# Patient Record
Sex: Female | Born: 1985 | Race: White | Hispanic: No | Marital: Single | Smoking: Never smoker
Health system: Southern US, Community
[De-identification: ages and names within clinical notes are randomized; demographics above are authoritative.]

## PROBLEM LIST (undated history)

## (undated) ENCOUNTER — Emergency Department (HOSPITAL_COMMUNITY): Admission: EM | Payer: Self-pay | Source: Home / Self Care

## (undated) DIAGNOSIS — F419 Anxiety disorder, unspecified: Secondary | ICD-10-CM

## (undated) DIAGNOSIS — E559 Vitamin D deficiency, unspecified: Secondary | ICD-10-CM

## (undated) DIAGNOSIS — F429 Obsessive-compulsive disorder, unspecified: Secondary | ICD-10-CM

## (undated) DIAGNOSIS — K449 Diaphragmatic hernia without obstruction or gangrene: Secondary | ICD-10-CM

## (undated) DIAGNOSIS — F329 Major depressive disorder, single episode, unspecified: Secondary | ICD-10-CM

## (undated) DIAGNOSIS — F32A Depression, unspecified: Secondary | ICD-10-CM

## (undated) DIAGNOSIS — F909 Attention-deficit hyperactivity disorder, unspecified type: Secondary | ICD-10-CM

## (undated) DIAGNOSIS — K219 Gastro-esophageal reflux disease without esophagitis: Secondary | ICD-10-CM

## (undated) HISTORY — PX: COLPOSCOPY: SHX161

## (undated) HISTORY — DX: Obsessive-compulsive disorder, unspecified: F42.9

## (undated) HISTORY — DX: Depression, unspecified: F32.A

## (undated) HISTORY — DX: Diaphragmatic hernia without obstruction or gangrene: K44.9

## (undated) HISTORY — DX: Gastro-esophageal reflux disease without esophagitis: K21.9

## (undated) HISTORY — DX: Attention-deficit hyperactivity disorder, unspecified type: F90.9

## (undated) HISTORY — DX: Vitamin D deficiency, unspecified: E55.9

## (undated) HISTORY — DX: Major depressive disorder, single episode, unspecified: F32.9

---

## 2012-04-29 ENCOUNTER — Encounter (HOSPITAL_COMMUNITY): Payer: Self-pay | Admitting: *Deleted

## 2012-04-29 ENCOUNTER — Emergency Department (HOSPITAL_COMMUNITY): Payer: No Typology Code available for payment source

## 2012-04-29 ENCOUNTER — Emergency Department (HOSPITAL_COMMUNITY)
Admission: EM | Admit: 2012-04-29 | Discharge: 2012-04-30 | Disposition: A | Discharge status: Home / Self Care | Payer: No Typology Code available for payment source | Attending: Emergency Medicine | Admitting: Emergency Medicine

## 2012-04-29 DIAGNOSIS — Z043 Encounter for examination and observation following other accident: Secondary | ICD-10-CM | POA: Diagnosis present

## 2012-04-29 DIAGNOSIS — F411 Generalized anxiety disorder: Secondary | ICD-10-CM | POA: Insufficient documentation

## 2012-04-29 DIAGNOSIS — Z882 Allergy status to sulfonamides status: Secondary | ICD-10-CM | POA: Insufficient documentation

## 2012-04-29 DIAGNOSIS — S2220XA Unspecified fracture of sternum, initial encounter for closed fracture: Secondary | ICD-10-CM | POA: Insufficient documentation

## 2012-04-29 DIAGNOSIS — R071 Chest pain on breathing: Secondary | ICD-10-CM | POA: Diagnosis present

## 2012-04-29 HISTORY — DX: Anxiety disorder, unspecified: F41.9

## 2012-04-29 MED ORDER — OXYCODONE-ACETAMINOPHEN 5-325 MG PO TABS
1.0000 | ORAL_TABLET | Freq: Once | ORAL | Status: AC
  Administered 2012-04-29: 1 via ORAL
  Filled 2012-04-29: qty 1

## 2012-04-29 MED ORDER — IBUPROFEN 800 MG PO TABS
800.0000 mg | ORAL_TABLET | Freq: Once | ORAL | Status: AC
  Administered 2012-04-29: 800 mg via ORAL
  Filled 2012-04-29: qty 1

## 2012-04-29 MED ORDER — IBUPROFEN 800 MG PO TABS: 800.0000 mg | ORAL_TABLET | Freq: Three times a day (TID) | ORAL | Status: AC

## 2012-04-29 MED ORDER — OXYCODONE-ACETAMINOPHEN 5-325 MG PO TABS: 2.0000 | ORAL_TABLET | ORAL | Status: AC | PRN
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
## 2012-04-29 NOTE — ED Notes (Signed)
Patient transported to X-ray 
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
## 2012-05-02 NOTE — ED Provider Notes (Signed)
Medical screening examination/treatment/procedure(s) were performed by non-physician practitioner and as supervising physician I was immediately available for consultation/collaboration.  Toy Baker, MD 05/02/12 [phone]

## 2013-06-17 ENCOUNTER — Encounter (HOSPITAL_COMMUNITY): Payer: Self-pay | Admitting: Emergency Medicine

## 2013-06-17 ENCOUNTER — Emergency Department (HOSPITAL_COMMUNITY)
Admission: EM | Admit: 2013-06-17 | Discharge: 2013-06-17 | Disposition: A | Discharge status: Home / Self Care | Payer: BC Managed Care – PPO | Attending: Emergency Medicine | Admitting: Emergency Medicine

## 2013-06-17 ENCOUNTER — Emergency Department (HOSPITAL_COMMUNITY): Payer: BC Managed Care – PPO

## 2013-06-17 DIAGNOSIS — F411 Generalized anxiety disorder: Secondary | ICD-10-CM | POA: Insufficient documentation

## 2013-06-17 DIAGNOSIS — T23209A Burn of second degree of unspecified hand, unspecified site, initial encounter: Secondary | ICD-10-CM | POA: Insufficient documentation

## 2013-06-17 DIAGNOSIS — M436 Torticollis: Secondary | ICD-10-CM | POA: Insufficient documentation

## 2013-06-17 DIAGNOSIS — Y9389 Activity, other specified: Secondary | ICD-10-CM | POA: Insufficient documentation

## 2013-06-17 DIAGNOSIS — Y92410 Unspecified street and highway as the place of occurrence of the external cause: Secondary | ICD-10-CM | POA: Insufficient documentation

## 2013-06-17 DIAGNOSIS — S63509A Unspecified sprain of unspecified wrist, initial encounter: Secondary | ICD-10-CM | POA: Insufficient documentation

## 2013-06-17 DIAGNOSIS — T310 Burns involving less than 10% of body surface: Secondary | ICD-10-CM | POA: Insufficient documentation

## 2013-06-17 DIAGNOSIS — Z79899 Other long term (current) drug therapy: Secondary | ICD-10-CM | POA: Insufficient documentation

## 2013-06-17 DIAGNOSIS — T300 Burn of unspecified body region, unspecified degree: Secondary | ICD-10-CM

## 2013-06-17 MED ORDER — HYDROCODONE-ACETAMINOPHEN 5-325 MG PO TABS
1.0000 | ORAL_TABLET | Freq: Once | ORAL | Status: AC
  Administered 2013-06-17: 1 via ORAL
  Filled 2013-06-17: qty 1

## 2013-06-17 MED ORDER — HYDROCODONE-ACETAMINOPHEN 5-325 MG PO TABS: 1.0000 | ORAL_TABLET | ORAL | Status: DC | PRN

## 2013-06-17 MED ORDER — IBUPROFEN 800 MG PO TABS: 800.0000 mg | ORAL_TABLET | Freq: Three times a day (TID) | ORAL | Status: DC

## 2013-06-17 MED ORDER — CYCLOBENZAPRINE HCL 10 MG PO TABS: 10.0000 mg | ORAL_TABLET | Freq: Two times a day (BID) | ORAL | Status: DC | PRN

## 2013-06-17 NOTE — ED Notes (Signed)
Discharge instructions reviewed with patient and female friend Rx x 3 reviewed with patient and female friend Follow up instructions reviewed with patient and female friend Patient and female friend verbalized understanding to DC instructions, Rx and follow up care All questions answered  Patient and female friend deny further needs or concerns at this time Patient alert and oriented x 4 upon time of DC and in NAD Patient declined wheelchair, ambulated without difficutly
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
## 2013-06-17 NOTE — ED Notes (Signed)
PA at bedside.
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
## 2013-06-21 NOTE — ED Provider Notes (Signed)
Medical screening examination/treatment/procedure(s) were performed by non-physician practitioner and as supervising physician I was immediately available for consultation/collaboration.  EKG Interpretation   None         Juwon Scripter F Jeily Guthridge, MD 06/21/13 0730 

## 2013-10-28 ENCOUNTER — Encounter (HOSPITAL_COMMUNITY): Payer: Self-pay | Admitting: Emergency Medicine

## 2013-10-28 ENCOUNTER — Emergency Department (HOSPITAL_COMMUNITY)
Admission: EM | Admit: 2013-10-28 | Discharge: 2013-10-28 | Disposition: A | Discharge status: Home / Self Care | Payer: BC Managed Care – PPO | Source: Home / Self Care

## 2013-10-28 DIAGNOSIS — J111 Influenza due to unidentified influenza virus with other respiratory manifestations: Secondary | ICD-10-CM

## 2013-10-28 LAB — POCT RAPID STREP A: STREPTOCOCCUS, GROUP A SCREEN (DIRECT): NEGATIVE

## 2013-10-28 MED ORDER — OSELTAMIVIR PHOSPHATE 75 MG PO CAPS: 75.0000 mg | ORAL_CAPSULE | Freq: Two times a day (BID) | ORAL | Status: DC

## 2013-10-28 MED ORDER — ONDANSETRON HCL 4 MG PO TABS: 4.0000 mg | ORAL_TABLET | Freq: Four times a day (QID) | ORAL | Status: DC

## 2013-10-28 NOTE — ED Provider Notes (Signed)
Medical screening examination/treatment/procedure(s) were performed by resident physician or non-physician practitioner and as supervising physician I was immediately available for consultation/collaboration.   Pauline Good MD.   Billy Fischer, MD 10/28/13 2129

## 2013-10-28 NOTE — ED Provider Notes (Signed)
CSN: [id number]     Arrival date & time 10/28/13  1646 History   First MD Initiated Contact with Patient 10/28/13 1916     Chief Complaint  Patient presents with  . Fever  . Cough   (Consider location/radiation/quality/duration/timing/severity/associated sxs/prior Treatment) HPI Comments: 28 y o f with complaints of flu-like sx's starting yesterday AM. Fever 100.8 yesterday to 102.5 this PM   Past Medical History  Diagnosis Date  . Anxiety    History reviewed. No pertinent past surgical history. No family history on file. History  Substance Use Topics  . Smoking status: Never Smoker   . Smokeless tobacco: Not on file  . Alcohol Use: No   OB History   Grav Para Term Preterm Abortions TAB SAB Ect Mult Living                 Review of Systems  Constitutional: Positive for fever, chills, activity change and appetite change. Negative for fatigue.  HENT: Positive for congestion, ear pain, postnasal drip, rhinorrhea and sore throat. Negative for facial swelling.   Eyes: Negative.   Respiratory: Positive for cough. Negative for wheezing.   Cardiovascular: Negative.   Gastrointestinal: Positive for nausea. Negative for vomiting, diarrhea and blood in stool.       Mild lower abdominal discomfort  Genitourinary: Negative for dysuria, urgency, frequency, flank pain, vaginal bleeding, vaginal discharge, difficulty urinating, vaginal pain, menstrual problem and pelvic pain.  Musculoskeletal: Negative for neck pain and neck stiffness.  Skin: Negative for pallor and rash.  Neurological: Negative.     Allergies  Sulfa antibiotics  Home Medications   Current Outpatient Rx  Name  Route  Sig  Dispense  Refill  . acetaminophen (TYLENOL) 325 MG tablet   Oral   Take 650 mg by mouth every 6 (six) hours as needed for pain (pain).         . cyclobenzaprine (FLEXERIL) 10 MG tablet   Oral   Take 1 tablet (10 mg total) by mouth 2 (two) times daily as needed for muscle spasms.   20  tablet   0   . desogestrel-ethinyl estradiol (APRI) 0.15-30 MG-MCG tablet   Oral   Take 1 tablet by mouth daily.         . diphenhydrAMINE (BENADRYL) 25 MG tablet   Oral   Take 50 mg by mouth every 6 (six) hours as needed for itching (allergies).         Marland Kitchen FLUoxetine (PROZAC) 20 MG tablet   Oral   Take 60 mg by mouth daily. Takes 3 capsules by mouth once daily         . HYDROcodone-acetaminophen (NORCO/VICODIN) 5-325 MG per tablet   Oral   Take 1-2 tablets by mouth every 4 (four) hours as needed for pain.   12 tablet   0   . ibuprofen (ADVIL,MOTRIN) 800 MG tablet   Oral   Take 1 tablet (800 mg total) by mouth 3 (three) times daily.   21 tablet   0   . ondansetron (ZOFRAN) 4 MG tablet   Oral   Take 1 tablet (4 mg total) by mouth every 6 (six) hours.   12 tablet   0   . oseltamivir (TAMIFLU) 75 MG capsule   Oral   Take 1 capsule (75 mg total) by mouth every 12 (twelve) hours.   10 capsule   0    BP 115/78  Pulse 72  Temp(Src) 99.3 F (37.4 C) (Oral)  SpO2 98%  Physical Exam  Nursing note and vitals reviewed. Constitutional: She is oriented to person, place, and time. She appears well-developed and well-nourished. No distress.  HENT:  Mouth/Throat: No oropharyngeal exudate.  Bilat TM's Nl OP moderate erythema, no exudates  Eyes: EOM are normal.  Neck: Normal range of motion. Neck supple.  Cardiovascular: Regular rhythm and normal heart sounds.   Tachycardia at 104  Pulmonary/Chest: Effort normal and breath sounds normal. No respiratory distress. She has no wheezes. She has no rales.  Abdominal: Soft. She exhibits no distension and no mass. There is no rebound and no guarding.  Mild tenderness across the lower most abdomen.  Musculoskeletal: Normal range of motion. She exhibits no edema.  Lymphadenopathy:    She has no cervical adenopathy.  Neurological: She is alert and oriented to person, place, and time.  Skin: Skin is warm and dry. No rash noted.   Psychiatric: She has a normal mood and affect.    ED Course  Procedures (including critical care time) Labs Review Labs Reviewed  POCT RAPID STREP A ([facility]RG CARE ONLY)   Imaging Review No results found. Results for orders placed during the hospital encounter of 10/28/13  POCT RAPID STREP A ([facility] CARE ONLY)      Result Value Ref Range   Streptococcus, Group A Screen (Direct) NEGATIVE  NEGATIVE     MDM   1. Influenza      FLu instructions Tamiflu Alka Seltzer Cold Night Fluids zofran Rest  Janne Napoleon, NP 10/28/13 [phone]

## 2013-10-28 NOTE — ED Notes (Signed)
Patient states started yesterday with a fever Has cough, right ear pain Using OTC remedies with no relief

## 2013-10-28 NOTE — Discharge Instructions (Signed)
Influenza, Adult Alka Seltzer Cold Plus Night Robitussin DM Influenza ("the flu") is a viral infection of the respiratory tract. It occurs more often in winter months because people spend more time in close contact with one another. Influenza can make you feel very sick. Influenza easily spreads from person to person (contagious). CAUSES  Influenza is caused by a virus that infects the respiratory tract. You can catch the virus by breathing in droplets from an infected person's cough or sneeze. You can also catch the virus by touching something that was recently contaminated with the virus and then touching your mouth, nose, or eyes. SYMPTOMS  Symptoms typically last 4 to 10 days and may include:  Fever.  Chills.  Headache, body aches, and muscle aches.  Sore throat.  Chest discomfort and cough.  Poor appetite.  Weakness or feeling tired.  Dizziness.  Nausea or vomiting. DIAGNOSIS  Diagnosis of influenza is often made based on your history and a physical exam. A nose or throat swab test can be done to confirm the diagnosis. RISKS AND COMPLICATIONS You may be at risk for a more severe case of influenza if you smoke cigarettes, have diabetes, have chronic heart disease (such as heart failure) or lung disease (such as asthma), or if you have a weakened immune system. Elderly people and pregnant women are also at risk for more serious infections. The most common complication of influenza is a lung infection (pneumonia). Sometimes, this complication can require emergency medical care and may be life-threatening. PREVENTION  An annual influenza vaccination (flu shot) is the best way to avoid getting influenza. An annual flu shot is now routinely recommended for all adults in the [place] TREATMENT  In mild cases, influenza goes away on its own. Treatment is directed at relieving symptoms. For more severe cases, your caregiver may prescribe antiviral medicines to shorten the sickness.  Antibiotic medicines are not effective, because the infection is caused by a virus, not by bacteria. HOME CARE INSTRUCTIONS  Only take over-the-counter or prescription medicines for pain, discomfort, or fever as directed by your caregiver.  Use a cool mist humidifier to make breathing easier.  Get plenty of rest until your temperature returns to normal. This usually takes 3 to 4 days.  Drink enough fluids to keep your urine clear or pale yellow.  Cover your mouth and nose when coughing or sneezing, and wash your hands well to avoid spreading the virus.  Stay home from work or school until your fever has been gone for at least 1 full day. SEEK MEDICAL CARE IF:   You have chest pain or a deep cough that worsens or produces more mucus.  You have nausea, vomiting, or diarrhea. SEEK IMMEDIATE MEDICAL CARE IF:   You have difficulty breathing, shortness of breath, or your skin or nails turn bluish.  You have severe neck pain or stiffness.  You have a severe headache, facial pain, or earache.  You have a worsening or recurring fever.  You have nausea or vomiting that cannot be controlled. MAKE SURE YOU:  Understand these instructions.  Will watch your condition.  Will get help right away if you are not doing well or get worse. Document Released: 08/12/2000 Document Revised: 02/14/2012 Document Reviewed: 11/14/2011 [facility] Patient Information 2014 [place], [place].  Viral Infections A viral infection can be caused by different types of viruses.Most viral infections are not serious and resolve on their own. However, some infections may cause severe symptoms and may lead to further complications. SYMPTOMS  Viruses can frequently cause:  Minor sore throat.  Aches and pains.  Headaches.  Runny nose.  Different types of rashes.  Watery eyes.  Tiredness.  Cough.  Loss of appetite.  Gastrointestinal infections, resulting in nausea, vomiting, and diarrhea. These  symptoms do not respond to antibiotics because the infection is not caused by bacteria. However, you might catch a bacterial infection following the viral infection. This is sometimes called a "superinfection." Symptoms of such a bacterial infection may include:  Worsening sore throat with pus and difficulty swallowing.  Swollen neck glands.  Chills and a high or persistent fever.  Severe headache.  Tenderness over the sinuses.  Persistent overall ill feeling (malaise), muscle aches, and tiredness (fatigue).  Persistent cough.  Yellow, green, or brown mucus production with coughing. HOME CARE INSTRUCTIONS   Only take over-the-counter or prescription medicines for pain, discomfort, diarrhea, or fever as directed by your caregiver.  Drink enough water and fluids to keep your urine clear or pale yellow. Sports drinks can provide valuable electrolytes, sugars, and hydration.  Get plenty of rest and maintain proper nutrition. Soups and broths with crackers or rice are fine. SEEK IMMEDIATE MEDICAL CARE IF:   You have severe headaches, shortness of breath, chest pain, neck pain, or an unusual rash.  You have uncontrolled vomiting, diarrhea, or you are unable to keep down fluids.  You or your child has an oral temperature above 102 F (38.9 C), not controlled by medicine.  Your baby is older than 3 months with a rectal temperature of 102 F (38.9 C) or higher.  Your baby is 57 months old or younger with a rectal temperature of 100.4 F (38 C) or higher. MAKE SURE YOU:   Understand these instructions.  Will watch your condition.  Will get help right away if you are not doing well or get worse. Document Released: 05/25/2005 Document Revised: 11/07/2011 Document Reviewed: 12/20/2010 [facility] Patient Information 2014 [place], [place].

## 2013-10-30 LAB — CULTURE, GROUP A STREP
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
## 2014-10-21 ENCOUNTER — Encounter (HOSPITAL_COMMUNITY): Payer: Self-pay | Admitting: Emergency Medicine

## 2014-10-21 ENCOUNTER — Emergency Department (HOSPITAL_COMMUNITY)
Admission: EM | Admit: 2014-10-21 | Discharge: 2014-10-21 | Disposition: A | Discharge status: Home / Self Care | Payer: BLUE CROSS/BLUE SHIELD | Attending: Emergency Medicine | Admitting: Emergency Medicine

## 2014-10-21 DIAGNOSIS — L0231 Cutaneous abscess of buttock: Secondary | ICD-10-CM | POA: Insufficient documentation

## 2014-10-21 DIAGNOSIS — R000 Tachycardia, unspecified: Secondary | ICD-10-CM | POA: Diagnosis not present

## 2014-10-21 DIAGNOSIS — F419 Anxiety disorder, unspecified: Secondary | ICD-10-CM | POA: Diagnosis not present

## 2014-10-21 DIAGNOSIS — Z79899 Other long term (current) drug therapy: Secondary | ICD-10-CM | POA: Diagnosis not present

## 2014-10-21 DIAGNOSIS — R630 Anorexia: Secondary | ICD-10-CM | POA: Insufficient documentation

## 2014-10-21 DIAGNOSIS — Z792 Long term (current) use of antibiotics: Secondary | ICD-10-CM | POA: Diagnosis not present

## 2014-10-21 MED ORDER — IBUPROFEN 800 MG PO TABS
800.0000 mg | ORAL_TABLET | Freq: Once | ORAL | Status: AC
  Administered 2014-10-21: 800 mg via ORAL
  Filled 2014-10-21: qty 1

## 2014-10-21 MED ORDER — CEPHALEXIN 500 MG PO CAPS: 500.0000 mg | ORAL_CAPSULE | Freq: Four times a day (QID) | ORAL | Status: DC

## 2014-10-21 MED ORDER — OXYCODONE-ACETAMINOPHEN 5-325 MG PO TABS: 1.0000 | ORAL_TABLET | ORAL | Status: DC | PRN

## 2014-10-21 MED ORDER — IBUPROFEN 800 MG PO TABS: 800.0000 mg | ORAL_TABLET | Freq: Three times a day (TID) | ORAL | Status: DC

## 2014-10-21 NOTE — ED Provider Notes (Signed)
CSN: [id number]     Arrival date & time 10/21/14  1237 History  This chart was scribed for non-physician practitioner, Comer Locket, PA-C, working with Debby Freiberg, MD by Ladene Artist, ED Scribe. This patient was seen in room WTR7/WTR7 and the patient's care was started at 1:45 PM.   No chief complaint on file.  The history is provided by the patient. No language interpreter was used.   HPI Comments: Kristin Whitehead is a 29 y.o. female, with a h/o anxiety, who presents to the Emergency Department complaining of constant, gradually worsening sacral pain onset 3 days ago. Pt states that she first noted pain after driving for a few hours 3 days ago. She reports worsening pain 2 days ago after driving another 1.5 hours. Pt currently rates pain as 9/10 and reports that pain is so severe that it keeps her from sleeping and causes her to tremor. Pain is exacerbated with sitting, walking and standing. No alleviating factors. Pt reports associated loss of appetite and subjective fever. She denies abdominal pain, vaginal bleeding, vaginal discharge, any urinary symptoms. Pt reports family h/o pilonidal cyst; no personal history. Pt has been treating with Tylenol without relief. Allergy to sulfa.  Past Medical History  Diagnosis Date  . Anxiety    History reviewed. No pertinent past surgical history. History reviewed. No pertinent family history. History  Substance Use Topics  . Smoking status: Never Smoker   . Smokeless tobacco: Not on file  . Alcohol Use: No   OB History    No data available     Review of Systems  Constitutional: Positive for fever (subjective) and appetite change.  Gastrointestinal: Negative for abdominal pain.  Genitourinary: Negative for urgency, frequency, vaginal bleeding, vaginal discharge and difficulty urinating.  Skin:       + Abscess   Allergies  Sulfa antibiotics  Home Medications   Prior to Admission medications   Medication Sig Start Date End Date  Taking? Authorizing Provider  acetaminophen (TYLENOL) 325 MG tablet Take 650 mg by mouth every 6 (six) hours as needed for pain (pain).    Historical Provider, MD  cephALEXin (KEFLEX) 500 MG capsule Take 1 capsule (500 mg total) by mouth 4 (four) times daily. 10/21/14   Viona Gilmore Camora Tremain, PA-C  cyclobenzaprine (FLEXERIL) 10 MG tablet Take 1 tablet (10 mg total) by mouth 2 (two) times daily as needed for muscle spasms. 06/17/13   Shari A Upstill, PA-C  desogestrel-ethinyl estradiol (APRI) 0.15-30 MG-MCG tablet Take 1 tablet by mouth daily.    Historical Provider, MD  diphenhydrAMINE (BENADRYL) 25 MG tablet Take 50 mg by mouth every 6 (six) hours as needed for itching (allergies).    Historical Provider, MD  FLUoxetine (PROZAC) 20 MG tablet Take 60 mg by mouth daily. Takes 3 capsules by mouth once daily    Historical Provider, MD  HYDROcodone-acetaminophen (NORCO/VICODIN) 5-325 MG per tablet Take 1-2 tablets by mouth every 4 (four) hours as needed for pain. 06/17/13   Shari A Upstill, PA-C  ibuprofen (ADVIL,MOTRIN) 800 MG tablet Take 1 tablet (800 mg total) by mouth 3 (three) times daily. 10/21/14   Viona Gilmore Ezekiel Menzer, PA-C  ondansetron (ZOFRAN) 4 MG tablet Take 1 tablet (4 mg total) by mouth every 6 (six) hours. 10/28/13   Janne Napoleon, NP  oseltamivir (TAMIFLU) 75 MG capsule Take 1 capsule (75 mg total) by mouth every 12 (twelve) hours. 10/28/13   Janne Napoleon, NP  oxyCODONE-acetaminophen (PERCOCET/ROXICET) 5-325 MG per tablet Take 1 tablet by  mouth every 4 (four) hours as needed for moderate pain or severe pain. 10/21/14   Viona Gilmore Mimi Debellis, PA-C   BP 132/80 mmHg  Pulse 103  Temp(Src) 98.3 F (36.8 C) (Oral)  Resp 16  SpO2 100%  LMP 10/16/2014 Physical Exam  Constitutional: She is oriented to person, place, and time. She appears well-developed and well-nourished. No distress.  HENT:  Head: Normocephalic and atraumatic.  Eyes: Conjunctivae and EOM are normal.  Neck: Neck supple. No tracheal deviation  present.  Cardiovascular: Normal rate.   Pulmonary/Chest: Effort normal. No respiratory distress.  Genitourinary:  Chaperone present for the entirety of exam.  Approximately 2 cm area of induration on the border of the L superior aspect of the gluteal cleft. Mild erythema. No surrounding cellulitis, no drainage or fluctuance. No evidence of rectal involvement.   Musculoskeletal: Normal range of motion.  Neurological: She is alert and oriented to person, place, and time.  Skin: Skin is warm and dry.  Psychiatric: She has a normal mood and affect. Her behavior is normal.  Nursing note and vitals reviewed.  ED Course  Procedures (including critical care time) DIAGNOSTIC STUDIES: Oxygen Saturation is 100% on RA, normal by my interpretation.    COORDINATION OF CARE: 1:54 PM-Discussed treatment plan which includes Motrin, Keflex, Percocet and return precautions with pt at bedside and pt agreed to plan.   Labs Review Labs Reviewed - No data to display  Imaging Review No results found.   EKG Interpretation None     Meds given in ED:  Medications  ibuprofen (ADVIL,MOTRIN) tablet 800 mg (800 mg Oral Given 10/21/14 1413)    Discharge Medication List as of 10/21/2014  1:57 PM    START taking these medications   Details  cephALEXin (KEFLEX) 500 MG capsule Take 1 capsule (500 mg total) by mouth 4 (four) times daily., Starting 10/21/2014, Until Discontinued, Print    oxyCODONE-acetaminophen (PERCOCET/ROXICET) 5-325 MG per tablet Take 1 tablet by mouth every 4 (four) hours as needed for moderate pain or severe pain., Starting 10/21/2014, Until Discontinued, Print       Filed Vitals:   10/21/14 1301  BP: 132/80  Pulse: 103  Temp: 98.3 F (36.8 C)  TempSrc: Oral  Resp: 16  SpO2: 100%     MDM  Vitals stable - afebrile. Mildly tachycardic, likely secondary to pain. Pt resting comfortably in ED. PE--not concerning further acute or emergent pathology. Abscess does not appear to have  any perirectal involvement.  Will DC with antibiotics, pain medicines. I discussed all relevant lab findings and imaging results with pt and they verbalized understanding. Discussed f/u with PCP within 48 hrs and return precautions, pt very amenable to plan.  Final diagnoses:  Abscess, gluteal cleft    I personally performed the services described in this documentation, which was scribed in my presence. The recorded information has been reviewed and is accurate.   Viona Gilmore Ephraim, PA-C 10/21/14 2049  Debby Freiberg, MD 10/23/14 [phone]

## 2014-10-21 NOTE — Discharge Instructions (Signed)
Abscess An abscess is an infected area that contains a collection of pus and debris.It can occur in almost any part of the body. An abscess is also known as a furuncle or boil. CAUSES  An abscess occurs when tissue gets infected. This can occur from blockage of oil or sweat glands, infection of hair follicles, or a minor injury to the skin. As the body tries to fight the infection, pus collects in the area and creates pressure under the skin. This pressure causes pain. People with weakened immune systems have difficulty fighting infections and get certain abscesses more often.  SYMPTOMS Usually an abscess develops on the skin and becomes a painful mass that is red, warm, and tender. If the abscess forms under the skin, you may feel a moveable soft area under the skin. Some abscesses break open (rupture) on their own, but most will continue to get worse without care. The infection can spread deeper into the body and eventually into the bloodstream, causing you to feel ill.  DIAGNOSIS  Your caregiver will take your medical history and perform a physical exam. A sample of fluid may also be taken from the abscess to determine what is causing your infection. TREATMENT  Your caregiver may prescribe antibiotic medicines to fight the infection. However, taking antibiotics alone usually does not cure an abscess. Your caregiver may need to make a small cut (incision) in the abscess to drain the pus. In some cases, gauze is packed into the abscess to reduce pain and to continue draining the area. HOME CARE INSTRUCTIONS   Only take over-the-counter or prescription medicines for pain, discomfort, or fever as directed by your caregiver.  If you were prescribed antibiotics, take them as directed. Finish them even if you start to feel better.  If gauze is used, follow your caregiver's directions for changing the gauze.  To avoid spreading the infection:  Keep your draining abscess covered with a  bandage.  Wash your hands well.  Do not share personal care items, towels, or whirlpools with others.  Avoid skin contact with others.  Keep your skin and clothes clean around the abscess.  Keep all follow-up appointments as directed by your caregiver. SEEK MEDICAL CARE IF:   You have increased pain, swelling, redness, fluid drainage, or bleeding.  You have muscle aches, chills, or a general ill feeling.  You have a fever. MAKE SURE YOU:   Understand these instructions.  Will watch your condition.  Will get help right away if you are not doing well or get worse. Document Released: 05/25/2005 Document Revised: 02/14/2012 Document Reviewed: 10/28/2011 [facility] Patient Information 2015 [place], [place]. This information is not intended to replace advice given to you by your health care provider. Make sure you discuss any questions you have with your health care provider.  Is important for you to take all of her antibiotics as indicated. If your symptoms do not improve after your antibiotics it is important for you to come back for reevaluation. You may also try warm compresses to facilitate healing. Please follow-up with your PCP, [place] [facility] in order to establish care. Return to ED for new or worsening symptoms.

## 2014-10-21 NOTE — ED Notes (Signed)
Pt c/o sacral pain onset Saturday after driving in car for long period. Pt states that it has worsened progressively since she had to drive 1.5 hours on Sunday, patient began feeling some nausea with pain, denies emesis. Denies loss of control of bowel or bladder.

## 2016-06-09 ENCOUNTER — Ambulatory Visit (INDEPENDENT_AMBULATORY_CARE_PROVIDER_SITE_OTHER): Payer: BC Managed Care – PPO | Admitting: Obstetrics and Gynecology

## 2016-06-09 ENCOUNTER — Encounter: Payer: Self-pay | Admitting: Obstetrics and Gynecology

## 2016-06-09 VITALS — BP 112/80 | HR 88 | Resp 14 | Ht 65.5 in | Wt 143.0 lb

## 2016-06-09 DIAGNOSIS — Z124 Encounter for screening for malignant neoplasm of cervix: Secondary | ICD-10-CM | POA: Diagnosis not present

## 2016-06-09 DIAGNOSIS — Z01419 Encounter for gynecological examination (general) (routine) without abnormal findings: Secondary | ICD-10-CM

## 2016-06-09 DIAGNOSIS — Z3041 Encounter for surveillance of contraceptive pills: Secondary | ICD-10-CM

## 2016-06-09 MED ORDER — DESOGESTREL-ETHINYL ESTRADIOL 0.15-30 MG-MCG PO TABS: 1.0000 | ORAL_TABLET | Freq: Every day | ORAL | 4 refills | Status: DC

## 2016-06-09 NOTE — Progress Notes (Addendum)
30 y.o. G0P0000 SingleCaucasianF here for annual exam.  On OCP's. Period Cycle (Days): 28 Period Duration (Days): 5 days  Period Pattern: Regular Menstrual Flow: Moderate, Heavy Menstrual Control: Maxi pad Dysmenorrhea: None  She changes an overnight pad in 5 hours. No BTB. Mild, tolerable cramps. Not currently sexually active, no concerns for STD's.  She is on prozac for anxiety and depression, she is going to f/u with the mood disorder clinic.  Patient's last menstrual period was 05/13/2016.          Sexually active: Yes.    The current method of family planning is OCP (estrogen/progesterone).    Exercising: No.  The patient does not participate in regular exercise at present. Smoker:  no  Health Maintenance: Pap:  05/30/15 WNL per patient  History of abnormal Pap:  Yes + HPV in 2011. TDaP: 2010 Gardasil: no   reports that she has never smoked. She has never used smokeless tobacco. She reports that she does not drink alcohol or use drugs. She works as a Licensed conveyancer at [employer]   Past Medical History:  Diagnosis Date  . Anxiety   . Depression     Past Surgical History:  Procedure Laterality Date  . COLPOSCOPY      Current Outpatient Prescriptions  Medication Sig Dispense Refill  . desogestrel-ethinyl estradiol (APRI) 0.15-30 MG-MCG tablet Take 1 tablet by mouth daily.    Marland Kitchen FLUoxetine (PROZAC) 40 MG capsule TK 2 CS PO QD  1  . ibuprofen (ADVIL,MOTRIN) 800 MG tablet Take 1 tablet (800 mg total) by mouth 3 (three) times daily. 21 tablet 0   No current facility-administered medications for this visit.     Family History  Problem Relation Age of Onset  . Bone cancer Maternal Grandmother   . Breast cancer Maternal Grandmother   . Diabetes Maternal Grandfather   . Mental illness Paternal Grandmother   . Diabetes Paternal Grandfather   . Heart disease Paternal Grandfather     Review of Systems  Constitutional: Negative.   HENT: Negative.   Eyes: Negative.    Respiratory: Negative.   Cardiovascular: Negative.   Gastrointestinal: Negative.   Endocrine: Negative.   Genitourinary: Negative.   Musculoskeletal: Negative.   Skin: Negative.   Allergic/Immunologic: Negative.   Neurological: Negative.   Psychiatric/Behavioral: Negative.   She has mild IBS, currently okay.   Exam:   BP 112/80 (BP Location: Right Arm, Patient Position: Sitting, Cuff Size: Normal)   Pulse 88   Resp 14   Ht 5' 5.5" (1.664 m)   Wt 143 lb (64.9 kg)   LMP 05/13/2016   BMI 23.43 kg/m   Weight change: @WEIGHTCHANGE @ Height:   Height: 5' 5.5" (166.4 cm)  Ht Readings from Last 3 Encounters:  06/09/16 5' 5.5" (1.664 m)    General appearance: alert, cooperative and appears stated age Head: Normocephalic, without obvious abnormality, atraumatic Neck: no adenopathy, supple, symmetrical, trachea midline and thyroid normal to inspection and palpation Lungs: clear to auscultation bilaterally Breasts: normal appearance, no masses or tenderness Heart: regular rate and rhythm Abdomen: soft, non-tender; bowel sounds normal; no masses,  no organomegaly Extremities: extremities normal, atraumatic, no cyanosis or edema Skin: Skin color, texture, turgor normal. No rashes or lesions Lymph nodes: Cervical, supraclavicular, and axillary nodes normal. No abnormal inguinal nodes palpated Neurologic: Grossly normal   Pelvic: External genitalia:  no lesions              Urethra:  normal appearing urethra with no masses, tenderness  or lesions              Bartholins and Skenes: normal                 Vagina: normal appearing vagina with normal color and discharge, no lesions              Cervix: no lesions               Bimanual Exam:  Uterus:  normal size, contour, position, consistency, mobility, non-tender              Adnexa: no mass, fullness, tenderness               Rectovaginal: Confirms               Anus:  normal sphincter tone, no lesions  Chaperone was present for  exam.  A:  Well Woman with normal exam  OCP's doing well  P:   Recently had normal blood work at work  Pap with reflex hpv  Discussed breast self exam  Discussed calcium and vit D intake  Continue OCP's  Addendum: old records reviewed from [facility]  09/29/10: pap with LSIL, negative HPV She had a colposcopy with GYN, results not available Pap from 06/13/15: ASCUS with negative HPV  Addendum: Colpo results: biopsy with hpv effect, no dysplasia

## 2016-06-09 NOTE — Patient Instructions (Signed)
EXERCISE AND DIET:  We recommended that you start or continue a regular exercise program for good health. Regular exercise means any activity that makes your heart beat faster and makes you sweat.  We recommend exercising at least 30 minutes per day at least 3 days a week, preferably 4 or 5.  We also recommend a diet low in fat and sugar.  Inactivity, poor dietary choices and obesity can cause diabetes, heart attack, stroke, and kidney damage, among others.    ALCOHOL AND SMOKING:  Women should limit their alcohol intake to no more than 7 drinks/beers/glasses of wine (combined, not each!) per week. Moderation of alcohol intake to this level decreases your risk of breast cancer and liver damage. And of course, no recreational drugs are part of a healthy lifestyle.  And absolutely no smoking or even second hand smoke. Most people know smoking can cause heart and lung diseases, but did you know it also contributes to weakening of your bones? Aging of your skin?  Yellowing of your teeth and nails?  CALCIUM AND VITAMIN D:  Adequate intake of calcium and Vitamin D are recommended.  The recommendations for exact amounts of these supplements seem to change often, but generally speaking 600 mg of calcium (either carbonate or citrate) and 800 units of Vitamin D per day seems prudent. Certain women may benefit from higher intake of Vitamin D.  If you are among these women, your doctor will have told you during your visit.    PAP SMEARS:  Pap smears, to check for cervical cancer or precancers,  have traditionally been done yearly, although recent scientific advances have shown that most women can have pap smears less often.  However, every woman still should have a physical exam from her gynecologist every year. It will include a breast check, inspection of the vulva and vagina to check for abnormal growths or skin changes, a visual exam of the cervix, and then an exam to evaluate the size and shape of the uterus and  ovaries.  And after 29 years of age, a rectal exam is indicated to check for rectal cancers. We will also provide age appropriate advice regarding health maintenance, like when you should have certain vaccines, screening for sexually transmitted diseases, bone density testing, colonoscopy, mammograms, etc.   MAMMOGRAMS:  All women over 40 years old should have a yearly mammogram. Many facilities now offer a "3D" mammogram, which may cost around $50 extra out of pocket. If possible,  we recommend you accept the option to have the 3D mammogram performed.  It both reduces the number of women who will be called back for extra views which then turn out to be normal, and it is better than the routine mammogram at detecting truly abnormal areas.    COLONOSCOPY:  Colonoscopy to screen for colon cancer is recommended for all women at age 50.  We know, you hate the idea of the prep.  We agree, BUT, having colon cancer and not knowing it is worse!!  Colon cancer so often starts as a polyp that can be seen and removed at colonscopy, which can quite literally save your life!  And if your first colonoscopy is normal and you have no family history of colon cancer, most women don't have to have it again for 10 years.  Once every ten years, you can do something that may end up saving your life, right?  We will be happy to help you get it scheduled when you are ready.    Be sure to check your insurance coverage so you understand how much it will cost.  It may be covered as a preventative service at no cost, but you should check your particular policy.      Breast Self-Awareness Practicing breast self-awareness may pick up problems early, prevent significant medical complications, and possibly save your life. By practicing breast self-awareness, you can become familiar with how your breasts look and feel and if your breasts are changing. This allows you to notice changes early. It can also offer you some reassurance that your  breast health is good. One way to learn what is normal for your breasts and whether your breasts are changing is to do a breast self-exam. If you find a lump or something that was not present in the past, it is best to contact your caregiver right away. Other findings that should be evaluated by your caregiver include nipple discharge, especially if it is bloody; skin changes or reddening; areas where the skin seems to be pulled in (retracted); or new lumps and bumps. Breast pain is seldom associated with cancer (malignancy), but should also be evaluated by a caregiver. HOW TO PERFORM A BREAST SELF-EXAM The best time to examine your breasts is 5-7 days after your menstrual period is over. During menstruation, the breasts are lumpier, and it may be more difficult to pick up changes. If you do not menstruate, have reached menopause, or had your uterus removed (hysterectomy), you should examine your breasts at regular intervals, such as monthly. If you are breastfeeding, examine your breasts after a feeding or after using a breast pump. Breast implants do not decrease the risk for lumps or tumors, so continue to perform breast self-exams as recommended. Talk to your caregiver about how to determine the difference between the implant and breast tissue. Also, talk about the amount of pressure you should use during the exam. Over time, you will become more familiar with the variations of your breasts and more comfortable with the exam. A breast self-exam requires you to remove all your clothes above the waist. 1. Look at your breasts and nipples. Stand in front of a mirror in a room with good lighting. With your hands on your hips, push your hands firmly downward. Look for a difference in shape, contour, and size from one breast to the other (asymmetry). Asymmetry includes puckers, dips, or bumps. Also, look for skin changes, such as reddened or scaly areas on the breasts. Look for nipple changes, such as discharge,  dimpling, repositioning, or redness. 2. Carefully feel your breasts. This is best done either in the shower or tub while using soapy water or when flat on your back. Place the arm (on the side of the breast you are examining) above your head. Use the pads (not the fingertips) of your three middle fingers on your opposite hand to feel your breasts. Start in the underarm area and use  inch (2 cm) overlapping circles to feel your breast. Use 3 different levels of pressure (light, medium, and firm pressure) at each circle before moving to the next circle. The light pressure is needed to feel the tissue closest to the skin. The medium pressure will help to feel breast tissue a little deeper, while the firm pressure is needed to feel the tissue close to the ribs. Continue the overlapping circles, moving downward over the breast until you feel your ribs below your breast. Then, move one finger-width towards the center of the body. Continue to use the    inch (2 cm) overlapping circles to feel your breast as you move slowly up toward the collar bone (clavicle) near the base of the neck. Continue the up and down exam using all 3 pressures until you reach the middle of the chest. Do this with each breast, carefully feeling for lumps or changes. 3.  Keep a written record with breast changes or normal findings for each breast. By writing this information down, you do not need to depend only on memory for size, tenderness, or location. Write down where you are in your menstrual cycle, if you are still menstruating. Breast tissue can have some lumps or thick tissue. However, see your caregiver if you find anything that concerns you.  SEEK MEDICAL CARE IF:  You see a change in shape, contour, or size of your breasts or nipples.   You see skin changes, such as reddened or scaly areas on the breasts or nipples.   You have an unusual discharge from your nipples.   You feel a new lump or unusually thick areas.     This information is not intended to replace advice given to you by your health care provider. Make sure you discuss any questions you have with your health care provider.   Document Released: 08/15/2005 Document Revised: 08/01/2012 Document Reviewed: 11/30/2011 Elsevier Interactive Patient Education 2016 [employer]  

## 2016-06-13 LAB — IPS PAP TEST WITH REFLEX TO HPV

## 2017-06-04 ENCOUNTER — Other Ambulatory Visit: Payer: Self-pay | Admitting: Obstetrics and Gynecology

## 2017-06-05 NOTE — Telephone Encounter (Signed)
Medication refill request: OCP  Last AEX:  06-09-16  Next AEX: 06-14-17  Last MMG (if hormonal medication request): N/A Refill authorized: please advise

## 2017-06-06 ENCOUNTER — Other Ambulatory Visit: Payer: Self-pay | Admitting: Obstetrics and Gynecology

## 2017-06-06 NOTE — Telephone Encounter (Signed)
Medication refill request: OCP Last AEX:  06/09/16 JJ Next AEX: 06/14/17 JJ Last MMG (if hormonal medication request): none Refill authorized: 06/05/17 #[id number] to [facility] [address]

## 2017-06-14 ENCOUNTER — Ambulatory Visit: Payer: BC Managed Care – PPO | Admitting: Obstetrics and Gynecology

## 2017-07-03 ENCOUNTER — Other Ambulatory Visit: Payer: Self-pay | Admitting: Obstetrics and Gynecology

## 2017-07-03 NOTE — Telephone Encounter (Signed)
Medication refill request: OCP  Last AEX:  06-09-16  Next AEX: 08-30-17  Last MMG (if hormonal medication request): N/A Refill authorized: please advise

## 2017-08-30 ENCOUNTER — Ambulatory Visit: Payer: BC Managed Care – PPO | Admitting: Obstetrics and Gynecology

## 2017-09-25 ENCOUNTER — Other Ambulatory Visit: Payer: Self-pay | Admitting: Obstetrics and Gynecology

## 2017-09-25 NOTE — Telephone Encounter (Signed)
Medication refill request: OCP Last AEX:  06/09/16 JJ  Next AEX: 11/02/17  Last MMG (if hormonal medication request): n/a Refill authorized: 07/03/17 #84, 0 RF. Today, please advise.

## 2017-09-25 NOTE — Telephone Encounter (Signed)
2 months sent. Last refill until annual exam

## 2017-09-25 NOTE — Telephone Encounter (Signed)
Message noted from Dr. Talbert Nan. Encounter closed.

## 2017-11-02 ENCOUNTER — Other Ambulatory Visit: Payer: Self-pay

## 2017-11-02 ENCOUNTER — Encounter: Payer: Self-pay | Admitting: Obstetrics and Gynecology

## 2017-11-02 ENCOUNTER — Ambulatory Visit: Payer: BC Managed Care – PPO | Admitting: Obstetrics and Gynecology

## 2017-11-02 VITALS — BP 124/70 | HR 84 | Resp 12 | Ht 65.5 in | Wt 144.0 lb

## 2017-11-02 DIAGNOSIS — Z23 Encounter for immunization: Secondary | ICD-10-CM

## 2017-11-02 DIAGNOSIS — Z7189 Other specified counseling: Secondary | ICD-10-CM | POA: Diagnosis not present

## 2017-11-02 DIAGNOSIS — Z7185 Encounter for immunization safety counseling: Secondary | ICD-10-CM

## 2017-11-02 DIAGNOSIS — E559 Vitamin D deficiency, unspecified: Secondary | ICD-10-CM | POA: Diagnosis not present

## 2017-11-02 DIAGNOSIS — Z01419 Encounter for gynecological examination (general) (routine) without abnormal findings: Secondary | ICD-10-CM | POA: Diagnosis not present

## 2017-11-02 DIAGNOSIS — F429 Obsessive-compulsive disorder, unspecified: Secondary | ICD-10-CM | POA: Insufficient documentation

## 2017-11-02 DIAGNOSIS — Z3041 Encounter for surveillance of contraceptive pills: Secondary | ICD-10-CM

## 2017-11-02 DIAGNOSIS — Z0000 Encounter for general adult medical examination without abnormal findings: Secondary | ICD-10-CM

## 2017-11-02 MED ORDER — DESOGESTREL-ETHINYL ESTRADIOL 0.15-30 MG-MCG PO TABS: 1.0000 | ORAL_TABLET | Freq: Every day | ORAL | 3 refills | Status: DC

## 2017-11-02 NOTE — Patient Instructions (Signed)
EXERCISE AND DIET:  We recommended that you start or continue a regular exercise program for good health. Regular exercise means any activity that makes your heart beat faster and makes you sweat.  We recommend exercising at least 30 minutes per day at least 3 days a week, preferably 4 or 5.  We also recommend a diet low in fat and sugar.  Inactivity, poor dietary choices and obesity can cause diabetes, heart attack, stroke, and kidney damage, among others.    ALCOHOL AND SMOKING:  Women should limit their alcohol intake to no more than 7 drinks/beers/glasses of wine (combined, not each!) per week. Moderation of alcohol intake to this level decreases your risk of breast cancer and liver damage. And of course, no recreational drugs are part of a healthy lifestyle.  And absolutely no smoking or even second hand smoke. Most people know smoking can cause heart and lung diseases, but did you know it also contributes to weakening of your bones? Aging of your skin?  Yellowing of your teeth and nails?  CALCIUM AND VITAMIN D:  Adequate intake of calcium and Vitamin D are recommended.  The recommendations for exact amounts of these supplements seem to change often, but generally speaking 600 mg of calcium (either carbonate or citrate) and 800 units of Vitamin D per day seems prudent. Certain women may benefit from higher intake of Vitamin D.  If you are among these women, your doctor will have told you during your visit.    PAP SMEARS:  Pap smears, to check for cervical cancer or precancers,  have traditionally been done yearly, although recent scientific advances have shown that most women can have pap smears less often.  However, every woman still should have a physical exam from her gynecologist every year. It will include a breast check, inspection of the vulva and vagina to check for abnormal growths or skin changes, a visual exam of the cervix, and then an exam to evaluate the size and shape of the uterus and  ovaries.  And after 31 years of age, a rectal exam is indicated to check for rectal cancers. We will also provide age appropriate advice regarding health maintenance, like when you should have certain vaccines, screening for sexually transmitted diseases, bone density testing, colonoscopy, mammograms, etc.   MAMMOGRAMS:  All women over 40 years old should have a yearly mammogram. Many facilities now offer a "3D" mammogram, which may cost around $50 extra out of pocket. If possible,  we recommend you accept the option to have the 3D mammogram performed.  It both reduces the number of women who will be called back for extra views which then turn out to be normal, and it is better than the routine mammogram at detecting truly abnormal areas.    COLONOSCOPY:  Colonoscopy to screen for colon cancer is recommended for all women at age 50.  We know, you hate the idea of the prep.  We agree, BUT, having colon cancer and not knowing it is worse!!  Colon cancer so often starts as a polyp that can be seen and removed at colonscopy, which can quite literally save your life!  And if your first colonoscopy is normal and you have no family history of colon cancer, most women don't have to have it again for 10 years.  Once every ten years, you can do something that may end up saving your life, right?  We will be happy to help you get it scheduled when you are ready.    Be sure to check your insurance coverage so you understand how much it will cost.  It may be covered as a preventative service at no cost, but you should check your particular policy.      Breast Self-Awareness Breast self-awareness means being familiar with how your breasts look and feel. It involves checking your breasts regularly and reporting any changes to your health care provider. Practicing breast self-awareness is important. A change in your breasts can be a sign of a serious medical problem. Being familiar with how your breasts look and feel allows  you to find any problems early, when treatment is more likely to be successful. All women should practice breast self-awareness, including women who have had breast implants. How to do a breast self-exam One way to learn what is normal for your breasts and whether your breasts are changing is to do a breast self-exam. To do a breast self-exam: Look for Changes  1. Remove all the clothing above your waist. 2. Stand in front of a mirror in a room with good lighting. 3. Put your hands on your hips. 4. Push your hands firmly downward. 5. Compare your breasts in the mirror. Look for differences between them (asymmetry), such as: ? Differences in shape. ? Differences in size. ? Puckers, dips, and bumps in one breast and not the other. 6. Look at each breast for changes in your skin, such as: ? Redness. ? Scaly areas. 7. Look for changes in your nipples, such as: ? Discharge. ? Bleeding. ? Dimpling. ? Redness. ? A change in position. Feel for Changes  Carefully feel your breasts for lumps and changes. It is best to do this while lying on your back on the floor and again while sitting or standing in the shower or tub with soapy water on your skin. Feel each breast in the following way:  Place the arm on the side of the breast you are examining above your head.  Feel your breast with the other hand.  Start in the nipple area and make  inch (2 cm) overlapping circles to feel your breast. Use the pads of your three middle fingers to do this. Apply light pressure, then medium pressure, then firm pressure. The light pressure will allow you to feel the tissue closest to the skin. The medium pressure will allow you to feel the tissue that is a little deeper. The firm pressure will allow you to feel the tissue close to the ribs.  Continue the overlapping circles, moving downward over the breast until you feel your ribs below your breast.  Move one finger-width toward the center of the body.  Continue to use the  inch (2 cm) overlapping circles to feel your breast as you move slowly up toward your collarbone.  Continue the up and down exam using all three pressures until you reach your armpit.  Write Down What You Find  Write down what is normal for each breast and any changes that you find. Keep a written record with breast changes or normal findings for each breast. By writing this information down, you do not need to depend only on memory for size, tenderness, or location. Write down where you are in your menstrual cycle, if you are still menstruating. If you are having trouble noticing differences in your breasts, do not get discouraged. With time you will become more familiar with the variations in your breasts and more comfortable with the exam. How often should I examine my breasts? Examine   your breasts every month. If you are breastfeeding, the best time to examine your breasts is after a feeding or after using a breast pump. If you menstruate, the best time to examine your breasts is 5-7 days after your period is over. During your period, your breasts are lumpier, and it may be more difficult to notice changes. When should I see my health care provider? See your health care provider if you notice:  A change in shape or size of your breasts or nipples.  A change in the skin of your breast or nipples, such as a reddened or scaly area.  Unusual discharge from your nipples.  A lump or thick area that was not there before.  Pain in your breasts.  Anything that concerns you.  This information is not intended to replace advice given to you by your health care provider. Make sure you discuss any questions you have with your health care provider. Document Released: 08/15/2005 Document Revised: 01/21/2016 Document Reviewed: 07/05/2015 Elsevier Interactive Patient Education  2018 [employer]  

## 2017-11-02 NOTE — Progress Notes (Signed)
32 y.o. G0P0000 SingleCaucasianF here for annual exam.   She c/o migraines with her cycle since early last summer. Occurring every month. No aura. Overall tolerable. Tylenol or a nap helps.  Recently diagnosed with OCD.  Sexually active, but not in the last year.  Period Cycle (Days): 28 Period Duration (Days): 5 days  Period Pattern: Regular Menstrual Flow: Moderate Menstrual Control: Maxi pad Menstrual Control Change Freq (Hours): changes pad every 3-4 hours  Dysmenorrhea: (!) Moderate Dysmenorrhea Symptoms: Headache  Patient's last menstrual period was 10/28/2017.          Sexually active: Yes.    The current method of family planning is OCP (estrogen/progesterone).    Exercising: No.  The patient does not participate in regular exercise at present. Smoker:  no  Health Maintenance: Pap:  06-09-16 WNL  06-09-15 ASCUS NEG HR HPV  History of abnormal Pap:  Yes- ASCUS 2016 TDaP:  Unsure, Thinks 8/10 Gardasil: no    reports that  has never smoked. she has never used smokeless tobacco. She reports that she does not drink alcohol or use drugs. She works as a Licensed conveyancer at [employer] She has a 31 year old terrier mix dog and just adopted a cat.   Past Medical History:  Diagnosis Date  . Anxiety   . Depression     Past Surgical History:  Procedure Laterality Date  . COLPOSCOPY      Current Outpatient Medications  Medication Sig Dispense Refill  . APRI 0.15-30 MG-MCG tablet TAKE 1 TABLET BY MOUTH EVERY DAY 2 Package 0  . FLUoxetine (PROZAC) 40 MG capsule TK 2 CS PO QD  1   No current facility-administered medications for this visit.     Family History  Problem Relation Age of Onset  . Bone cancer Maternal Grandmother   . Breast cancer Maternal Grandmother   . Diabetes Maternal Grandfather   . Mental illness Paternal Grandmother   . Diabetes Paternal Grandfather   . Heart disease Paternal Grandfather     Review of Systems  Constitutional: Negative.   HENT:  Negative.   Eyes: Negative.   Respiratory: Negative.   Cardiovascular: Negative.   Gastrointestinal: Negative.   Endocrine: Negative.   Genitourinary: Negative.   Musculoskeletal: Negative.   Skin: Negative.   Allergic/Immunologic: Negative.   Neurological: Positive for headaches.  Psychiatric/Behavioral: Negative.        Depression and anxiety   Mood is fairly well controlled, recent diagnosis of OCD. Prozac prevents her panic attacks.   Exam:   BP 124/70 (BP Location: Right Arm, Patient Position: Sitting, Cuff Size: Normal)   Pulse 84   Resp 12   Ht 5' 5.5" (1.664 m)   Wt 144 lb (65.3 kg)   LMP 10/28/2017   BMI 23.60 kg/m   Weight change: @WEIGHTCHANGE @ Height:   Height: 5' 5.5" (166.4 cm)  Ht Readings from Last 3 Encounters:  11/02/17 5' 5.5" (1.664 m)  06/09/16 5' 5.5" (1.664 m)    General appearance: alert, cooperative and appears stated age Head: Normocephalic, without obvious abnormality, atraumatic Neck: no adenopathy, supple, symmetrical, trachea midline and thyroid normal to inspection and palpation Lungs: clear to auscultation bilaterally Cardiovascular: regular rate and rhythm Breasts: normal appearance, no masses or tenderness Abdomen: soft, non-tender; non distended,  no masses,  no organomegaly Extremities: extremities normal, atraumatic, no cyanosis or edema Skin: Skin color, texture, turgor normal. No rashes or lesions Lymph nodes: Cervical, supraclavicular, and axillary nodes normal. No abnormal inguinal nodes palpated Neurologic:  Grossly normal   Pelvic: External genitalia:  no lesions              Urethra:  normal appearing urethra with no masses, tenderness or lesions              Bartholins and Skenes: normal                 Vagina: normal appearing vagina with normal color and discharge, no lesions              Cervix: no lesions               Bimanual Exam:  Uterus:  normal size, contour, position, consistency, mobility, non-tender               Adnexa: no mass, fullness, tenderness               Rectovaginal: Confirms               Anus:  normal sphincter tone, no lesions  Chaperone was present for exam.  A:  Well Woman with normal exam  H/O vit d def  OCD, depression, anxiety, managed by Psychiatry    P:   No pap this year  Discussed breast self exam  Discussed calcium and vit D intake  Continue OCP's  Discussed changing to the 3 month pill, she will call if she wants to. Currently wants to stay on the monthly pill  Screening labs, vit D

## 2017-11-02 NOTE — Addendum Note (Signed)
Addended by: Lowella Fairy on: 11/02/2017 04:40 PM   Modules accepted: Orders

## 2017-11-03 LAB — COMPREHENSIVE METABOLIC PANEL
A/G RATIO: 1.4 (ref 1.2–2.2)
ALT: 11 IU/L (ref 0–32)
AST: 18 IU/L (ref 0–40)
Albumin: 3.9 g/dL (ref 3.5–5.5)
Alkaline Phosphatase: 107 IU/L (ref 39–117)
BUN/Creatinine Ratio: 13 (ref 9–23)
BUN: 9 mg/dL (ref 6–20)
Bilirubin Total: 0.2 mg/dL (ref 0.0–1.2)
CALCIUM: 9 mg/dL (ref 8.7–10.2)
CO2: 23 mmol/L (ref 20–29)
CREATININE: 0.71 mg/dL (ref 0.57–1.00)
Chloride: 102 mmol/L (ref 96–106)
GFR calc Af Amer: 132 mL/min/{1.73_m2} (ref >59)
GFR, EST NON AFRICAN AMERICAN: 115 mL/min/{1.73_m2} (ref >59)
GLUCOSE: 71 mg/dL (ref 65–99)
Globulin, Total: 2.8 g/dL (ref 1.5–4.5)
POTASSIUM: 3.9 mmol/L (ref 3.5–5.2)
Sodium: 138 mmol/L (ref 134–144)
TOTAL PROTEIN: 6.7 g/dL (ref 6.0–8.5)

## 2017-11-03 LAB — LIPID PANEL
CHOL/HDL RATIO: 3.5 ratio (ref 0.0–4.4)
Cholesterol, Total: 166 mg/dL (ref 100–199)
HDL: 47 mg/dL (ref >39)
LDL Calculated: 100 mg/dL — ABNORMAL HIGH (ref 0–99)
TRIGLYCERIDES: 96 mg/dL (ref 0–149)
VLDL CHOLESTEROL CAL: 19 mg/dL (ref 5–40)

## 2017-11-03 LAB — CBC
Hematocrit: 41.2 % (ref 34.0–46.6)
Hemoglobin: 13.9 g/dL (ref 11.1–15.9)
MCH: 31.7 pg (ref 26.6–33.0)
MCHC: 33.7 g/dL (ref 31.5–35.7)
MCV: 94 fL (ref 79–97)
Platelets: 368 10*3/uL (ref 150–379)
RBC: 4.39 x10E6/uL (ref 3.77–5.28)
RDW: 12.3 % (ref 12.3–15.4)
WBC: 9.3 10*3/uL (ref 3.4–10.8)

## 2017-11-03 LAB — VITAMIN D 25 HYDROXY (VIT D DEFICIENCY, FRACTURES): Vit D, 25-Hydroxy: 19.3 ng/mL — ABNORMAL LOW (ref 30.0–100.0)

## 2018-01-02 ENCOUNTER — Ambulatory Visit (INDEPENDENT_AMBULATORY_CARE_PROVIDER_SITE_OTHER): Payer: BC Managed Care – PPO | Admitting: *Deleted

## 2018-01-02 ENCOUNTER — Other Ambulatory Visit: Payer: Self-pay

## 2018-01-02 VITALS — BP 112/80 | HR 76 | Resp 12 | Ht 66.0 in | Wt 147.4 lb

## 2018-01-02 DIAGNOSIS — Z23 Encounter for immunization: Secondary | ICD-10-CM

## 2018-01-02 NOTE — Progress Notes (Signed)
Patient in today for second Gardasil injection.   Contraception: OCP  LMP: 12/23/17  Last AEX: 11/02/17 with Dr. Talbert Nan   Injection given in right deltoid. Patient tolerated shot well.   Patient informed next injection due in about 4 months.  Advised patient, if not on birth control, to return for next injection with cycle.   Routed to provider for final review.  Encounter closed.

## 2018-03-15 ENCOUNTER — Emergency Department (HOSPITAL_COMMUNITY)
Admission: EM | Admit: 2018-03-15 | Discharge: 2018-03-15 | Disposition: A | Discharge status: Home / Self Care | Payer: BC Managed Care – PPO | Attending: Emergency Medicine | Admitting: Emergency Medicine

## 2018-03-15 ENCOUNTER — Other Ambulatory Visit: Payer: Self-pay

## 2018-03-15 ENCOUNTER — Encounter (HOSPITAL_COMMUNITY): Payer: Self-pay | Admitting: *Deleted

## 2018-03-15 DIAGNOSIS — R21 Rash and other nonspecific skin eruption: Secondary | ICD-10-CM | POA: Insufficient documentation

## 2018-03-15 DIAGNOSIS — R1013 Epigastric pain: Secondary | ICD-10-CM | POA: Diagnosis not present

## 2018-03-15 DIAGNOSIS — J029 Acute pharyngitis, unspecified: Secondary | ICD-10-CM | POA: Diagnosis not present

## 2018-03-15 LAB — CBC WITH DIFFERENTIAL/PLATELET
BASOS PCT: 0 %
Basophils Absolute: 0 10*3/uL (ref 0.0–0.1)
Eosinophils Absolute: 0.2 10*3/uL (ref 0.0–0.7)
Eosinophils Relative: 3 %
HEMATOCRIT: 45.4 % (ref 36.0–46.0)
Hemoglobin: 15.6 g/dL — ABNORMAL HIGH (ref 12.0–15.0)
LYMPHS ABS: 3.1 10*3/uL (ref 0.7–4.0)
Lymphocytes Relative: 38 %
MCH: 32.1 pg (ref 26.0–34.0)
MCHC: 34.4 g/dL (ref 30.0–36.0)
MCV: 93.4 fL (ref 78.0–100.0)
MONO ABS: 0.6 10*3/uL (ref 0.1–1.0)
MONOS PCT: 7 %
Neutro Abs: 4.2 10*3/uL (ref 1.7–7.7)
Neutrophils Relative %: 52 %
Platelets: 301 10*3/uL (ref 150–400)
RBC: 4.86 MIL/uL (ref 3.87–5.11)
RDW: 12 % (ref 11.5–15.5)
WBC: 8.1 10*3/uL (ref 4.0–10.5)

## 2018-03-15 LAB — I-STAT CHEM 8, ED
BUN: 6 mg/dL (ref 6–20)
CALCIUM ION: 1.09 mmol/L — AB (ref 1.15–1.40)
CREATININE: 0.7 mg/dL (ref 0.44–1.00)
Chloride: 105 mmol/L (ref 98–111)
GLUCOSE: 75 mg/dL (ref 70–99)
HCT: 46 % (ref 36.0–46.0)
HEMOGLOBIN: 15.6 g/dL — AB (ref 12.0–15.0)
Potassium: 3.2 mmol/L — ABNORMAL LOW (ref 3.5–5.1)
Sodium: 140 mmol/L (ref 135–145)
TCO2: 24 mmol/L (ref 22–32)

## 2018-03-15 LAB — COMPREHENSIVE METABOLIC PANEL
ALBUMIN: 3.9 g/dL (ref 3.5–5.0)
ALT: 15 U/L (ref 0–44)
ANION GAP: 10 (ref 5–15)
AST: 26 U/L (ref 15–41)
Alkaline Phosphatase: 81 U/L (ref 38–126)
BILIRUBIN TOTAL: 0.5 mg/dL (ref 0.3–1.2)
BUN: 8 mg/dL (ref 6–20)
CALCIUM: 9.2 mg/dL (ref 8.9–10.3)
CO2: 25 mmol/L (ref 22–32)
Chloride: 106 mmol/L (ref 98–111)
Creatinine, Ser: 0.71 mg/dL (ref 0.44–1.00)
GLUCOSE: 75 mg/dL (ref 70–99)
POTASSIUM: 3.5 mmol/L (ref 3.5–5.1)
Sodium: 141 mmol/L (ref 135–145)
TOTAL PROTEIN: 7.7 g/dL (ref 6.5–8.1)

## 2018-03-15 LAB — LIPASE, BLOOD: LIPASE: 50 U/L (ref 11–51)

## 2018-03-15 LAB — I-STAT BETA HCG BLOOD, ED (MC, WL, AP ONLY)

## 2018-03-15 LAB — MONONUCLEOSIS SCREEN: MONO SCREEN: NEGATIVE

## 2018-03-15 MED ORDER — DIPHENHYDRAMINE HCL 50 MG/ML IJ SOLN
50.0000 mg | Freq: Once | INTRAMUSCULAR | Status: DC
  Filled 2018-03-15: qty 1

## 2018-03-15 MED ORDER — SODIUM CHLORIDE 0.9 % IV BOLUS
1000.0000 mL | Freq: Once | INTRAVENOUS | Status: AC
  Administered 2018-03-15: 1000 mL via INTRAVENOUS

## 2018-03-15 MED ORDER — METHYLPREDNISOLONE SODIUM SUCC 125 MG IJ SOLR
125.0000 mg | Freq: Once | INTRAMUSCULAR | Status: DC
  Filled 2018-03-15: qty 2

## 2018-03-15 MED ORDER — FAMOTIDINE IN NACL 20-0.9 MG/50ML-% IV SOLN
20.0000 mg | Freq: Once | INTRAVENOUS | Status: DC
  Filled 2018-03-15: qty 50

## 2018-03-15 NOTE — ED Provider Notes (Signed)
[facility] DEPT Provider Note   CSN: [id number] Arrival date & time: 03/15/18  2032     History   Chief Complaint Chief Complaint  Patient presents with  . Allergic Reaction    HPI Kristin Whitehead is a 32 y.o. female history of anxiety, depression here presenting with rash.  Patient states that about a week ago, she has some sore throat and fever.  She went to urgent care and had rapid strep that was negative and was thought to have viral pharyngitis.  She states that the fever resolved and she was feeling better.  She states that she has some sore throat and some epigastric pain since then.  She also noticed progressive worsening rash in bilateral lower extremity but also in her torso as well.  Patient notes that the rash is not itchy.  Patient states that her throat seems to be more sore and swollen today so came for evaluation. She also had a pedicure and her friends noticed her rash and told her to come and be evaluated in the ED. Denies any new meds or shampoos or detergents.   The history is provided by the patient.    Past Medical History:  Diagnosis Date  . Anxiety   . Depression   . OCD (obsessive compulsive disorder)     Patient Active Problem List   Diagnosis Date Noted  . OCD (obsessive compulsive disorder)     Past Surgical History:  Procedure Laterality Date  . COLPOSCOPY       OB History    Gravida  0   Para  0   Term  0   Preterm  0   AB  0   Living  0     SAB  0   TAB  0   Ectopic  0   Multiple  0   Live Births  0            Home Medications    Prior to Admission medications   Medication Sig Start Date End Date Taking? Authorizing Provider  BIOTIN PO Take 1 tablet by mouth daily.   Yes [provider]  desogestrel-ethinyl estradiol (APRI) 0.15-30 MG-MCG tablet Take 1 tablet by mouth daily. 11/02/17  Yes Salvadore Dom, MD  FLUoxetine (PROZAC) 40 MG capsule TK 2 CS PO QD 05/26/16  Yes  [provider]    Family History Family History  Problem Relation Age of Onset  . Bone cancer Maternal Grandmother   . Breast cancer Maternal Grandmother   . Diabetes Maternal Grandfather   . Mental illness Paternal Grandmother   . Diabetes Paternal Grandfather   . Heart disease Paternal Grandfather     Social History Social History   Tobacco Use  . Smoking status: Never Smoker  . Smokeless tobacco: Never Used  Substance Use Topics  . Alcohol use: No  . Drug use: No     Allergies   Sulfa antibiotics   Review of Systems Review of Systems  HENT: Positive for sore throat.   Gastrointestinal: Positive for abdominal pain.  Skin: Positive for rash.  All other systems reviewed and are negative.    Physical Exam Updated Vital Signs BP (!) 138/110 (BP Location: Right Arm)   Pulse 91   Temp 98.1 F (36.7 C) (Oral)   Resp 16   Ht 5\' 6"  (1.676 m)   Wt 65.8 kg (145 lb)   LMP 02/17/2018   SpO2 100%   BMI 23.40 kg/m  Physical Exam  Constitutional: She is oriented to person, place, and time. She appears well-developed and well-nourished.  HENT:  Head: Normocephalic.  Minimal tonsillar swelling, no exudates, uvula midline.   Eyes: Pupils are equal, round, and reactive to light. EOM are normal.  Neck: Normal range of motion. Neck supple.  Mild L cervical LAD, no stridor   Cardiovascular: Normal rate, regular rhythm and normal heart sounds.  Pulmonary/Chest: Effort normal and breath sounds normal. No stridor. No respiratory distress. She has no wheezes.  Abdominal: Soft. Bowel sounds are normal.  Minimal epigastric tenderness   Musculoskeletal: Normal range of motion.  Neurological: She is alert and oriented to person, place, and time. No cranial nerve deficit. Coordination normal.  Skin: Skin is warm.  ? Petechiae bilateral legs and back and torso. No obvious cellulitis   Psychiatric: She has a normal mood and affect.  Nursing note and vitals  reviewed.    ED Treatments / Results  Labs (all labs ordered are listed, but only abnormal results are displayed) Labs Reviewed  CBC WITH DIFFERENTIAL/PLATELET - Abnormal; Notable for the following components:      Result Value   Hemoglobin 15.6 (*)    All other components within normal limits  I-STAT CHEM 8, ED - Abnormal; Notable for the following components:   Potassium 3.2 (*)    Calcium, Ion 1.09 (*)    Hemoglobin 15.6 (*)    All other components within normal limits  COMPREHENSIVE METABOLIC PANEL  MONONUCLEOSIS SCREEN  LIPASE, BLOOD  EPSTEIN-BARR VIRUS VCA, IGG  EPSTEIN-BARR VIRUS VCA, IGM  I-STAT BETA HCG BLOOD, ED ([facility], [facility], [facility] ONLY)    EKG None  Radiology No results found.  Procedures Procedures (including critical care time)  Medications Ordered in ED Medications  sodium chloride 0.9 % bolus 1,000 mL (1,000 mLs Intravenous New Bag/Given 03/15/18 2115)     Initial Impression / Assessment and Plan / ED Course  I have reviewed the triage vital signs and the nursing notes.  Pertinent labs & imaging results that were available during my care of the patient were reviewed by me and considered in my medical decision making (see chart for details).    Kristin Whitehead is a 32 y.o. female here with sore throat, epigastric pain, rash. Consider mono given the sore throat and epigastric pain. Has possible petechiae in the lower extremities but has no pain and rash it not itchy. Will get cbc to look for thrombocytopenia and chemistry to look for renal function and LFTs. Will get mono and EBV titers as well. I doubt allergic reaction.   11:01 PM CBC normal. CMP unremarkable and LFTs nl. Mono spot neg. Stable for discharge.    Final Clinical Impressions(s) / ED Diagnoses   Final diagnoses:  None    ED Discharge Orders    None       Drenda Freeze, MD 03/15/18 2302

## 2018-03-15 NOTE — ED Triage Notes (Signed)
Pt reports generalized rash which started Sunday, started to feel "pressure" on the left her throat tonight.  She reports fever last week which she went to the [facility].  Denies fever since Thursday.  Denies tongue swelling.  Denies trouble breathing but feels like her throat feels "tight."

## 2018-03-15 NOTE — ED Notes (Signed)
Notified EDP,Yao,MD. Pt. I-stat Chem 8 results potassium 3.2 and RN,Garifo made aware.

## 2018-03-15 NOTE — Discharge Instructions (Signed)
Your blood count and mono spot is normal currently.   You can take benadryl as needed if the rash is itchy.   We sent off mono titers that are pending and your doctor can follow them up   See your doctor  Return to ER if you have worse rash, fever, trouble swallowing, trouble breathing, throat swelling.

## 2018-03-17 LAB — EPSTEIN-BARR VIRUS VCA, IGG: EBV VCA IgG: >600 U/mL — ABNORMAL HIGH (ref 0.0–17.9)

## 2018-03-17 LAB — EPSTEIN-BARR VIRUS VCA, IGM: EBV VCA IgM: <36 U/mL (ref 0.0–35.9)

## 2018-05-07 ENCOUNTER — Ambulatory Visit (INDEPENDENT_AMBULATORY_CARE_PROVIDER_SITE_OTHER): Payer: BC Managed Care – PPO

## 2018-05-07 VITALS — BP 108/62 | HR 66 | Resp 14 | Ht 65.0 in | Wt 147.0 lb

## 2018-05-07 DIAGNOSIS — Z23 Encounter for immunization: Secondary | ICD-10-CM

## 2018-05-07 NOTE — Progress Notes (Signed)
Patient in today for 3rd  Gardasil injection.   Contraception: OCP LMP: 04/16/18 Last AEX: 11/02/17 with JJ  Injection given in Left deltoid. Patient tolerated shot well.   Patient informed this is the last injection in the series.   Advised patient, if not on birth control, to return for next injection with cycle.   Routed to provider for final review.  Encounter closed.

## 2018-10-11 ENCOUNTER — Other Ambulatory Visit: Payer: Self-pay | Admitting: Obstetrics and Gynecology

## 2018-10-11 NOTE — Telephone Encounter (Signed)
Medication refill request: OCP Last AEX:  11/02/17 JJ Next AEX: 11/21/18 Last MMG (if hormonal medication request): n/a Refill authorized: 11/02/17 #3 w/3 refills; today please advise Order pended for #28 w/0 refills

## 2018-11-21 ENCOUNTER — Ambulatory Visit: Payer: BC Managed Care – PPO | Admitting: Obstetrics and Gynecology

## 2019-01-07 DIAGNOSIS — F331 Major depressive disorder, recurrent, moderate: Secondary | ICD-10-CM | POA: Insufficient documentation

## 2019-01-07 DIAGNOSIS — F900 Attention-deficit hyperactivity disorder, predominantly inattentive type: Secondary | ICD-10-CM | POA: Insufficient documentation

## 2019-01-10 ENCOUNTER — Other Ambulatory Visit: Payer: Self-pay | Admitting: Obstetrics and Gynecology

## 2019-01-10 NOTE — Telephone Encounter (Signed)
Medication refill request: OCP Last AEX:  11/02/17 JJ Next AEX: none Last MMG (if hormonal medication request): none Refill authorized: 10/11/18 #[id number]. Today please advise.

## 2019-01-13 MED ORDER — DESOGESTREL-ETHINYL ESTRADIOL 0.15-30 MG-MCG PO TABS: 1.0000 | ORAL_TABLET | Freq: Every day | ORAL | 0 refills | Status: DC

## 2019-01-13 NOTE — Telephone Encounter (Signed)
Please let the patient know that a 1 month refill of her OCP's has been sent. She needs an annual exam, please set this up.

## 2019-01-31 ENCOUNTER — Other Ambulatory Visit: Payer: Self-pay

## 2019-01-31 NOTE — Progress Notes (Signed)
33 y.o. G0P0000 Single White or Caucasian Not Hispanic or Latino female here for annual exam.   Period Cycle (Days): 28 Period Duration (Days): 5 Period Pattern: Regular Menstrual Flow: Light, Moderate Menstrual Control: Maxi pad Dysmenorrhea: (!) Mild Dysmenorrhea Symptoms: Cramping, Headache  She is still getting headaches with her cycle. She likes this pill and wants to stay on it. The headaches are not migraines helped with tylenol. No h/o auras.  With her OCD she likes the pill she is on.  Not sexually active in the last year. Has had negative std testing.   Patient's last menstrual period was 01/18/2019 (exact date).          Sexually active: No.  The current method of family planning is OCP (estrogen/progesterone).    Exercising: Yes.    walking Smoker:  no  Health Maintenance: Pap:  06-09-16 WNL, 06-09-15 ASCUS, negative hpv History of abnormal Pap:  Had a colposcopy in 2012, no cervical surgery.  MMG:  none BMD:   none Colonoscopy: none TDaP:  12/2018 Gardasil: yes   reports that she has never smoked. She has never used smokeless tobacco. She reports that she does not drink alcohol or use drugs. 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  Past Medical History:  Diagnosis Date  . Anxiety   . Depression   . OCD (obsessive compulsive disorder)     Past Surgical History:  Procedure Laterality Date  . COLPOSCOPY      Current Outpatient Medications  Medication Sig Dispense Refill  . cyanocobalamin (,VITAMIN B-12,) 1000 MCG/ML injection Inject into the muscle.    . desogestrel-ethinyl estradiol (APRI) 0.15-30 MG-MCG tablet Take 1 tablet by mouth daily. 28 tablet 0  . FLUoxetine (PROZAC) 40 MG capsule TK 2 CS PO QD  1  . Vitamin D, Ergocalciferol, (DRISDOL) 1.25 MG (50000 UT) CAPS capsule Take by mouth.     No current facility-administered medications for this visit.     Family History  Problem Relation Age of Onset  . Bone  cancer Maternal Grandmother   . Breast cancer Maternal Grandmother   . Diabetes Maternal Grandfather   . Mental illness Paternal Grandmother   . Diabetes Paternal Grandfather   . Heart disease Paternal Grandfather     Review of Systems  Constitutional: Negative.   HENT: Negative.   Eyes: Negative.   Respiratory: Negative.   Cardiovascular: Negative.   Gastrointestinal: Negative.   Endocrine: Negative.   Genitourinary: Negative.   Musculoskeletal: Negative.   Skin: Negative.   Allergic/Immunologic: Negative.   Neurological: Negative.   Psychiatric/Behavioral: Negative.     Exam:   BP 114/76   Pulse 64   Temp 98.4 F (36.9 C) (Skin)   Resp 16   Ht 5' 5.75" (1.67 m)   Wt 149 lb (67.6 kg)   LMP 01/18/2019 (Exact Date)   BMI 24.23 kg/m   Weight change: @WEIGHTCHANGE @ Height:   Height: 5' 5.75" (167 cm)  Ht Readings from Last 3 Encounters:  02/01/19 5' 5.75" (1.67 m)  05/07/18 5\' 5"  (1.651 m)  03/15/18 5\' 6"  (1.676 m)    General appearance: alert, cooperative and appears stated age Head: Normocephalic, without obvious abnormality, atraumatic Neck: no adenopathy, supple, symmetrical, trachea midline and thyroid normal to inspection and palpation Lungs: clear to auscultation bilaterally Cardiovascular: regular rate and rhythm Breasts: normal appearance, no masses or tenderness Abdomen: soft, non-tender; non distended,  no masses,  no organomegaly Extremities: extremities  normal, atraumatic, no cyanosis or edema Skin: Skin color, texture, turgor normal. No rashes or lesions Lymph nodes: Cervical, supraclavicular, and axillary nodes normal. No abnormal inguinal nodes palpated Neurologic: Grossly normal   Pelvic: External genitalia:  no lesions              Urethra:  normal appearing urethra with no masses, tenderness or lesions              Bartholins and Skenes: normal                 Vagina: normal appearing vagina with normal color and discharge, no lesions               Cervix: no lesions and friable with pap               Bimanual Exam:  Uterus:  normal size, contour, position, consistency, mobility, non-tender and retroverted              Adnexa: no mass, fullness, tenderness               Rectovaginal: Confirms               Anus:  normal sphincter tone, no lesions  Chaperone was present for exam.  A:  Well Woman with normal exam  Doing well on OCP's  P:   Pap with hpv  Labs with primary  Continue OCP's  Discussed breast self exam  Discussed calcium and vit D intake

## 2019-02-01 ENCOUNTER — Other Ambulatory Visit (HOSPITAL_COMMUNITY)
Admission: RE | Admit: 2019-02-01 | Discharge: 2019-02-01 | Disposition: A | Discharge status: Home / Self Care | Payer: BC Managed Care – PPO | Source: Ambulatory Visit | Attending: Obstetrics and Gynecology | Admitting: Obstetrics and Gynecology

## 2019-02-01 ENCOUNTER — Encounter: Payer: Self-pay | Admitting: Obstetrics and Gynecology

## 2019-02-01 ENCOUNTER — Ambulatory Visit: Payer: BC Managed Care – PPO | Admitting: Obstetrics and Gynecology

## 2019-02-01 ENCOUNTER — Other Ambulatory Visit: Payer: Self-pay

## 2019-02-01 VITALS — BP 114/76 | HR 64 | Temp 98.4°F | Resp 16 | Ht 65.75 in | Wt 149.0 lb

## 2019-02-01 DIAGNOSIS — Z124 Encounter for screening for malignant neoplasm of cervix: Secondary | ICD-10-CM

## 2019-02-01 DIAGNOSIS — Z01419 Encounter for gynecological examination (general) (routine) without abnormal findings: Secondary | ICD-10-CM | POA: Diagnosis not present

## 2019-02-01 DIAGNOSIS — Z3041 Encounter for surveillance of contraceptive pills: Secondary | ICD-10-CM

## 2019-02-01 MED ORDER — DESOGESTREL-ETHINYL ESTRADIOL 0.15-30 MG-MCG PO TABS: 1.0000 | ORAL_TABLET | Freq: Every day | ORAL | 3 refills | Status: DC

## 2019-02-01 NOTE — Patient Instructions (Signed)
EXERCISE AND DIET:  We recommended that you start or continue a regular exercise program for good health. Regular exercise means any activity that makes your heart beat faster and makes you sweat.  We recommend exercising at least 30 minutes per day at least 3 days a week, preferably 4 or 5.  We also recommend a diet low in fat and sugar.  Inactivity, poor dietary choices and obesity can cause diabetes, heart attack, stroke, and kidney damage, among others.    ALCOHOL AND SMOKING:  Women should limit their alcohol intake to no more than 7 drinks/beers/glasses of wine (combined, not each!) per week. Moderation of alcohol intake to this level decreases your risk of breast cancer and liver damage. And of course, no recreational drugs are part of a healthy lifestyle.  And absolutely no smoking or even second hand smoke. Most people know smoking can cause heart and lung diseases, but did you know it also contributes to weakening of your bones? Aging of your skin?  Yellowing of your teeth and nails?  CALCIUM AND VITAMIN D:  Adequate intake of calcium and Vitamin D are recommended.  The recommendations for exact amounts of these supplements seem to change often, but generally speaking 1,000 mg of calcium (between diet and supplement) and 800 units of Vitamin D per day seems prudent. Certain women may benefit from higher intake of Vitamin D.  If you are among these women, your doctor will have told you during your visit.    PAP SMEARS:  Pap smears, to check for cervical cancer or precancers,  have traditionally been done yearly, although recent scientific advances have shown that most women can have pap smears less often.  However, every woman still should have a physical exam from her gynecologist every year. It will include a breast check, inspection of the vulva and vagina to check for abnormal growths or skin changes, a visual exam of the cervix, and then an exam to evaluate the size and shape of the uterus and  ovaries.  And after 32 years of age, a rectal exam is indicated to check for rectal cancers. We will also provide age appropriate advice regarding health maintenance, like when you should have certain vaccines, screening for sexually transmitted diseases, bone density testing, colonoscopy, mammograms, etc.   MAMMOGRAMS:  All women over 40 years old should have a yearly mammogram. Many facilities now offer a "3D" mammogram, which may cost around $50 extra out of pocket. If possible,  we recommend you accept the option to have the 3D mammogram performed.  It both reduces the number of women who will be called back for extra views which then turn out to be normal, and it is better than the routine mammogram at detecting truly abnormal areas.    COLON CANCER SCREENING: Now recommend starting at age 45. At this time colonoscopy is not covered for routine screening until 50. There are take home tests that can be done between 45-49.   COLONOSCOPY:  Colonoscopy to screen for colon cancer is recommended for all women at age 50.  We know, you hate the idea of the prep.  We agree, BUT, having colon cancer and not knowing it is worse!!  Colon cancer so often starts as a polyp that can be seen and removed at colonscopy, which can quite literally save your life!  And if your first colonoscopy is normal and you have no family history of colon cancer, most women don't have to have it again for   10 years.  Once every ten years, you can do something that may end up saving your life, right?  We will be happy to help you get it scheduled when you are ready.  Be sure to check your insurance coverage so you understand how much it will cost.  It may be covered as a preventative service at no cost, but you should check your particular policy.      Breast Self-Awareness Breast self-awareness means being familiar with how your breasts look and feel. It involves checking your breasts regularly and reporting any changes to your  health care provider. Practicing breast self-awareness is important. A change in your breasts can be a sign of a serious medical problem. Being familiar with how your breasts look and feel allows you to find any problems early, when treatment is more likely to be successful. All women should practice breast self-awareness, including women who have had breast implants. How to do a breast self-exam One way to learn what is normal for your breasts and whether your breasts are changing is to do a breast self-exam. To do a breast self-exam: Look for Changes  1. Remove all the clothing above your waist. 2. Stand in front of a mirror in a room with good lighting. 3. Put your hands on your hips. 4. Push your hands firmly downward. 5. Compare your breasts in the mirror. Look for differences between them (asymmetry), such as: ? Differences in shape. ? Differences in size. ? Puckers, dips, and bumps in one breast and not the other. 6. Look at each breast for changes in your skin, such as: ? Redness. ? Scaly areas. 7. Look for changes in your nipples, such as: ? Discharge. ? Bleeding. ? Dimpling. ? Redness. ? A change in position. Feel for Changes Carefully feel your breasts for lumps and changes. It is best to do this while lying on your back on the floor and again while sitting or standing in the shower or tub with soapy water on your skin. Feel each breast in the following way:  Place the arm on the side of the breast you are examining above your head.  Feel your breast with the other hand.  Start in the nipple area and make  inch (2 cm) overlapping circles to feel your breast. Use the pads of your three middle fingers to do this. Apply light pressure, then medium pressure, then firm pressure. The light pressure will allow you to feel the tissue closest to the skin. The medium pressure will allow you to feel the tissue that is a little deeper. The firm pressure will allow you to feel the tissue  close to the ribs.  Continue the overlapping circles, moving downward over the breast until you feel your ribs below your breast.  Move one finger-width toward the center of the body. Continue to use the  inch (2 cm) overlapping circles to feel your breast as you move slowly up toward your collarbone.  Continue the up and down exam using all three pressures until you reach your armpit.  Write Down What You Find  Write down what is normal for each breast and any changes that you find. Keep a written record with breast changes or normal findings for each breast. By writing this information down, you do not need to depend only on memory for size, tenderness, or location. Write down where you are in your menstrual cycle, if you are still menstruating. If you are having trouble noticing differences   in your breasts, do not get discouraged. With time you will become more familiar with the variations in your breasts and more comfortable with the exam. How often should I examine my breasts? Examine your breasts every month. If you are breastfeeding, the best time to examine your breasts is after a feeding or after using a breast pump. If you menstruate, the best time to examine your breasts is 5-7 days after your period is over. During your period, your breasts are lumpier, and it may be more difficult to notice changes. When should I see my health care provider? See your health care provider if you notice:  A change in shape or size of your breasts or nipples.  A change in the skin of your breast or nipples, such as a reddened or scaly area.  Unusual discharge from your nipples.  A lump or thick area that was not there before.  Pain in your breasts.  Anything that concerns you.  

## 2019-02-05 LAB — CYTOLOGY - PAP
Diagnosis: NEGATIVE
HPV: NOT DETECTED

## 2019-02-18 ENCOUNTER — Ambulatory Visit: Payer: BC Managed Care – PPO | Admitting: Obstetrics and Gynecology

## 2019-12-23 ENCOUNTER — Other Ambulatory Visit: Payer: Self-pay | Admitting: Obstetrics and Gynecology

## 2019-12-23 NOTE — Telephone Encounter (Signed)
Medication refill request: Apri Last AEX:  02/01/19 JJ Next AEX: 02/07/20 Last MMG (if hormonal medication request): n/a Refill authorized: Please advise; Order pended #3 w/0 refills

## 2020-01-24 ENCOUNTER — Encounter: Payer: Self-pay | Admitting: Podiatry

## 2020-01-24 ENCOUNTER — Ambulatory Visit: Payer: BC Managed Care – PPO | Admitting: Podiatry

## 2020-01-24 ENCOUNTER — Other Ambulatory Visit: Payer: Self-pay

## 2020-01-24 VITALS — BP 116/84 | HR 93 | Temp 97.7°F | Resp 16

## 2020-01-24 DIAGNOSIS — F329 Major depressive disorder, single episode, unspecified: Secondary | ICD-10-CM | POA: Insufficient documentation

## 2020-01-24 DIAGNOSIS — L600 Ingrowing nail: Secondary | ICD-10-CM

## 2020-01-24 DIAGNOSIS — F410 Panic disorder [episodic paroxysmal anxiety] without agoraphobia: Secondary | ICD-10-CM | POA: Insufficient documentation

## 2020-01-24 MED ORDER — NEOMYCIN-POLYMYXIN-HC 3.5-10000-1 OT SOLN: OTIC | 1 refills | Status: DC

## 2020-01-24 NOTE — Patient Instructions (Signed)

## 2020-01-24 NOTE — Progress Notes (Signed)
   Subjective:    Patient ID: Kristin Whitehead, female    DOB: Jan 03, 1986, 34 y.o.   MRN: [id number]  HPI    Review of Systems  All other systems reviewed and are negative.      Objective:   Physical Exam        Assessment & Plan:

## 2020-01-24 NOTE — Progress Notes (Signed)
Subjective:   Patient ID: Kristin Whitehead, female   DOB: 34 y.o.   MRN: [id number]   HPI Patient states she has had chronic problems with her big toenails of both feet are thickened deformed she cannot cut them and they are painful.  Does not remember specific injury to them and patient does not smoke likes to be active   Review of Systems  All other systems reviewed and are negative.       Objective:  Physical Exam Vitals and nursing note reviewed.  Constitutional:      Appearance: She is well-developed.  Pulmonary:     Effort: Pulmonary effort is normal.  Musculoskeletal:        General: Normal range of motion.  Skin:    General: Skin is warm.  Neurological:     Mental Status: She is alert.     Neurovascular status intact muscle strength was found to be adequate with patient found to have severe damage thickened nailbeds hallux bilateral that are painful when pressed and make shoe gear difficult.  Patient is noted to have good digital perfusion well oriented x3     Assessment:  Chronic damage hallux nails bilateral that are painful when pressed and completely detached from nail bed     Plan:  Reviewed condition and the chronic nature of condition and I discussed removal explaining procedure.  Patient wants permanent correction I explained procedure risk and at this time I infiltrated each hallux 60 mg like Marcaine mixture sterile prep done and using sterile instrumentation remove the hallux nail bilateral exposed matrix applied phenol 5 applications 30 seconds followed by alcohol lavage sterile dressing and gave instructions on soaks.  Patient will keep dressings on 24 hours take them off early if any issues were to occur and is encouraged to call with questions concerns

## 2020-02-03 ENCOUNTER — Ambulatory Visit: Payer: BC Managed Care – PPO | Admitting: Obstetrics and Gynecology

## 2020-02-03 ENCOUNTER — Telehealth: Payer: Self-pay | Admitting: *Deleted

## 2020-02-03 NOTE — Telephone Encounter (Signed)
Pt states she had 2 toenails removed 01/24/2020 and has questions.

## 2020-02-03 NOTE — Telephone Encounter (Signed)
I spoke to pt and she asked how long she should soak the toes. I told pt at least 2-3 weeks longer if the entire toenails were removed and chemical applied. I told her she would perform the soaks until the areas got a dry hard scab without redness, drainage or swelling.

## 2020-02-07 ENCOUNTER — Ambulatory Visit: Payer: BC Managed Care – PPO | Admitting: Obstetrics and Gynecology

## 2020-02-13 NOTE — Progress Notes (Signed)
34 y.o. G0P0000 Single White or Caucasian Not Hispanic or Latino female here for annual exam.  Not sexually active in the last year.  Period Cycle (Days): 28 Period Duration (Days): 5 Period Pattern: Regular Menstrual Flow: Heavy Menstrual Control: Maxi pad Menstrual Control Change Freq (Hours): 3 Dysmenorrhea: (!) Mild Dysmenorrhea Symptoms: Cramping, Headache  Patient's last menstrual period was 02/12/2020.          Sexually active: No.  The current method of family planning is OCP (estrogen/progesterone).    Exercising: Yes.    stairs, walking the dog.  Smoker:  no  Health Maintenance: Pap:02/01/2019 neg HPV Neg   06-09-16 WNL, 06-09-15 ASCUS, negative hpv History of abnormal Pap:  Yes Had a colposcopy in 2012, no cervical surgery.  TDaP:  12/2018 Gardasil: complete Covid Vaccination series: complete.    reports that she has never smoked. She has never used smokeless tobacco. She reports that she does not drink alcohol and does not use drugs. 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 Anxiety   . Depression   . OCD (obsessive compulsive disorder)     Past Surgical History:  Procedure Laterality Date  . COLPOSCOPY    She had both of her large toenails removed, nails were "dead"  Current Outpatient Medications  Medication Sig Dispense Refill  . APRI 0.15-30 MG-MCG tablet TAKE 1 TABLET BY MOUTH EVERY DAY 84 tablet 0  . FLUoxetine (PROZAC) 40 MG capsule TK 2 CS PO QD  1  . neomycin-polymyxin-hydrocortisone (CORTISPORIN) OTIC solution Apply 1-2 drops to toe after soaking BID 10 mL 1  . Vitamin D, Ergocalciferol, (DRISDOL) 1.25 MG (50000 UT) CAPS capsule Take by mouth.     No current facility-administered medications for this visit.    Family History  Problem Relation Age of Onset  . Bone cancer Maternal Grandmother   . Breast cancer Maternal Grandmother   . Diabetes Maternal Grandfather   .  Mental illness Paternal Grandmother   . Diabetes Paternal Grandfather   . Heart disease Paternal Grandfather   . Stroke Father 14       issues with balance, doing okay  . Heart disease Father 81       MI s/p CABG    Review of Systems  All other systems reviewed and are negative.   Exam:   BP 110/62   Pulse 92   Temp (!) 97.3 F (36.3 C)   Ht 5\' 6"  (1.676 m)   Wt 153 lb (69.4 kg)   LMP 02/12/2020   SpO2 98%   BMI 24.69 kg/m   Weight change: @WEIGHTCHANGE @ Height:   Height: 5\' 6"  (167.6 cm)  Ht Readings from Last 3 Encounters:  02/17/20 5\' 6"  (1.676 m)  02/01/19 5' 5.75" (1.67 m)  05/07/18 5\' 5"  (1.651 m)    General appearance: alert, cooperative and appears stated age Head: Normocephalic, without obvious abnormality, atraumatic Neck: no adenopathy, supple, symmetrical, trachea midline and thyroid normal to inspection and palpation Lungs: clear to auscultation bilaterally Cardiovascular: regular rate and rhythm Breasts: normal appearance, no masses or tenderness Abdomen: soft, non-tender; non distended,  no masses,  no organomegaly Extremities: extremities normal, atraumatic, no cyanosis or edema Skin: Skin color, texture, turgor normal. No rashes or lesions Lymph nodes: Cervical, supraclavicular, and axillary nodes normal. No abnormal inguinal nodes palpated Neurologic: Grossly normal   Pelvic: External genitalia:  no lesions  Urethra:  normal appearing urethra with no masses, tenderness or lesions              Bartholins and Skenes: normal                 Vagina: normal appearing vagina with normal color and discharge, no lesions              Cervix: no lesions               Bimanual Exam:  Uterus:  normal size, contour, position, consistency, mobility, non-tender and retroverted              Adnexa: no mass, fullness, tenderness               Rectovaginal: Confirms               Anus:  normal sphincter tone, no lesions  Gae Dry chaperoned for  the exam.  A:  Well Woman with normal exam  P:   No pap this year  Labs with primary  Discussed breast self exam  Discussed calcium and vit D intake  Continue with OCP's

## 2020-02-17 ENCOUNTER — Encounter: Payer: Self-pay | Admitting: Obstetrics and Gynecology

## 2020-02-17 ENCOUNTER — Other Ambulatory Visit: Payer: Self-pay

## 2020-02-17 ENCOUNTER — Ambulatory Visit: Payer: BC Managed Care – PPO | Admitting: Obstetrics and Gynecology

## 2020-02-17 VITALS — BP 110/62 | HR 92 | Temp 97.3°F | Ht 66.0 in | Wt 153.0 lb

## 2020-02-17 DIAGNOSIS — Z01419 Encounter for gynecological examination (general) (routine) without abnormal findings: Secondary | ICD-10-CM

## 2020-02-17 DIAGNOSIS — Z3041 Encounter for surveillance of contraceptive pills: Secondary | ICD-10-CM

## 2020-02-17 MED ORDER — DESOGESTREL-ETHINYL ESTRADIOL 0.15-30 MG-MCG PO TABS: 1.0000 | ORAL_TABLET | Freq: Every day | ORAL | 3 refills | Status: DC

## 2020-02-17 NOTE — Patient Instructions (Signed)
EXERCISE AND DIET:  We recommended that you start or continue a regular exercise program for good health. Regular exercise means any activity that makes your heart beat faster and makes you sweat.  We recommend exercising at least 30 minutes per day at least 3 days a week, preferably 4 or 5.  We also recommend a diet low in fat and sugar.  Inactivity, poor dietary choices and obesity can cause diabetes, heart attack, stroke, and kidney damage, among others.    ALCOHOL AND SMOKING:  Women should limit their alcohol intake to no more than 7 drinks/beers/glasses of wine (combined, not each!) per week. Moderation of alcohol intake to this level decreases your risk of breast cancer and liver damage. And of course, no recreational drugs are part of a healthy lifestyle.  And absolutely no smoking or even second hand smoke. Most people know smoking can cause heart and lung diseases, but did you know it also contributes to weakening of your bones? Aging of your skin?  Yellowing of your teeth and nails?  CALCIUM AND VITAMIN D:  Adequate intake of calcium and Vitamin D are recommended.  The recommendations for exact amounts of these supplements seem to change often, but generally speaking 1,000 mg of calcium (between diet and supplement) and 800 units of Vitamin D per day seems prudent. Certain women may benefit from higher intake of Vitamin D.  If you are among these women, your doctor will have told you during your visit.    PAP SMEARS:  Pap smears, to check for cervical cancer or precancers,  have traditionally been done yearly, although recent scientific advances have shown that most women can have pap smears less often.  However, every woman still should have a physical exam from her gynecologist every year. It will include a breast check, inspection of the vulva and vagina to check for abnormal growths or skin changes, a visual exam of the cervix, and then an exam to evaluate the size and shape of the uterus and  ovaries.  And after 33 years of age, a rectal exam is indicated to check for rectal cancers. We will also provide age appropriate advice regarding health maintenance, like when you should have certain vaccines, screening for sexually transmitted diseases, bone density testing, colonoscopy, mammograms, etc.   MAMMOGRAMS:  All women over 40 years old should have a yearly mammogram. Many facilities now offer a "3D" mammogram, which may cost around $50 extra out of pocket. If possible,  we recommend you accept the option to have the 3D mammogram performed.  It both reduces the number of women who will be called back for extra views which then turn out to be normal, and it is better than the routine mammogram at detecting truly abnormal areas.    COLON CANCER SCREENING: Now recommend starting at age 45. At this time colonoscopy is not covered for routine screening until 50. There are take home tests that can be done between 45-49.   COLONOSCOPY:  Colonoscopy to screen for colon cancer is recommended for all women at age 50.  We know, you hate the idea of the prep.  We agree, BUT, having colon cancer and not knowing it is worse!!  Colon cancer so often starts as a polyp that can be seen and removed at colonscopy, which can quite literally save your life!  And if your first colonoscopy is normal and you have no family history of colon cancer, most women don't have to have it again for   10 years.  Once every ten years, you can do something that may end up saving your life, right?  We will be happy to help you get it scheduled when you are ready.  Be sure to check your insurance coverage so you understand how much it will cost.  It may be covered as a preventative service at no cost, but you should check your particular policy.      Breast Self-Awareness Breast self-awareness means being familiar with how your breasts look and feel. It involves checking your breasts regularly and reporting any changes to your  health care provider. Practicing breast self-awareness is important. A change in your breasts can be a sign of a serious medical problem. Being familiar with how your breasts look and feel allows you to find any problems early, when treatment is more likely to be successful. All women should practice breast self-awareness, including women who have had breast implants. How to do a breast self-exam One way to learn what is normal for your breasts and whether your breasts are changing is to do a breast self-exam. To do a breast self-exam: Look for Changes  1. Remove all the clothing above your waist. 2. Stand in front of a mirror in a room with good lighting. 3. Put your hands on your hips. 4. Push your hands firmly downward. 5. Compare your breasts in the mirror. Look for differences between them (asymmetry), such as: ? Differences in shape. ? Differences in size. ? Puckers, dips, and bumps in one breast and not the other. 6. Look at each breast for changes in your skin, such as: ? Redness. ? Scaly areas. 7. Look for changes in your nipples, such as: ? Discharge. ? Bleeding. ? Dimpling. ? Redness. ? A change in position. Feel for Changes Carefully feel your breasts for lumps and changes. It is best to do this while lying on your back on the floor and again while sitting or standing in the shower or tub with soapy water on your skin. Feel each breast in the following way:  Place the arm on the side of the breast you are examining above your head.  Feel your breast with the other hand.  Start in the nipple area and make  inch (2 cm) overlapping circles to feel your breast. Use the pads of your three middle fingers to do this. Apply light pressure, then medium pressure, then firm pressure. The light pressure will allow you to feel the tissue closest to the skin. The medium pressure will allow you to feel the tissue that is a little deeper. The firm pressure will allow you to feel the tissue  close to the ribs.  Continue the overlapping circles, moving downward over the breast until you feel your ribs below your breast.  Move one finger-width toward the center of the body. Continue to use the  inch (2 cm) overlapping circles to feel your breast as you move slowly up toward your collarbone.  Continue the up and down exam using all three pressures until you reach your armpit.  Write Down What You Find  Write down what is normal for each breast and any changes that you find. Keep a written record with breast changes or normal findings for each breast. By writing this information down, you do not need to depend only on memory for size, tenderness, or location. Write down where you are in your menstrual cycle, if you are still menstruating. If you are having trouble noticing differences   in your breasts, do not get discouraged. With time you will become more familiar with the variations in your breasts and more comfortable with the exam. How often should I examine my breasts? Examine your breasts every month. If you are breastfeeding, the best time to examine your breasts is after a feeding or after using a breast pump. If you menstruate, the best time to examine your breasts is 5-7 days after your period is over. During your period, your breasts are lumpier, and it may be more difficult to notice changes. When should I see my health care provider? See your health care provider if you notice:  A change in shape or size of your breasts or nipples.  A change in the skin of your breast or nipples, such as a reddened or scaly area.  Unusual discharge from your nipples.  A lump or thick area that was not there before.  Pain in your breasts.  Anything that concerns you.  

## 2020-09-02 ENCOUNTER — Encounter: Payer: Self-pay | Admitting: Obstetrics and Gynecology

## 2021-02-11 NOTE — Progress Notes (Signed)
35 y.o. G0P0000 Single White or Caucasian Not Hispanic or Latino female here for annual exam.  On OCP's. Not sexually active in the last year. Period Cycle (Days): 28 Period Duration (Days): 5 Period Pattern: Regular Menstrual Flow: Moderate Dysmenorrhea: (!) Moderate Dysmenorrhea Symptoms: Cramping  H/O vit d def, not on vit d.   Patient's last menstrual period was 02/04/2021.          Sexually active: No.  The current method of family planning is OCP (estrogen/progesterone).    Exercising: Yes.    Home exercise routine includes walks. Smoker:  no  Health Maintenance: Pap:  02-01-19 normal Neg HPV; 06-09-16 WNL, 06-09-15 ASCUS, negative hpv History of abnormal Pap:  yes, colposcopy in 2012, no cervical surgery. MMG:  N/A BMD:   N/A Colonoscopy: N/A TDaP:  2020 Gardasil: Complete   reports that she has never smoked. She has never used smokeless tobacco. She reports that she does not drink alcohol and does not use drugs. 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 84 tablet 3   FLUoxetine (PROZAC) 40 MG capsule TK 2 CS PO QD  1   No current facility-administered medications for this visit.    Family History  Problem Relation Age of Onset   Bone cancer Maternal Grandmother    Breast cancer Maternal Grandmother    Diabetes Maternal Grandfather    Mental illness Paternal Grandmother    Diabetes Paternal Grandfather    Heart disease Paternal Grandfather    Stroke Father 72       issues with balance, doing okay   Heart disease Father 66       MI s/p CABG    Review of Systems   All other systems reviewed and are negative.  Exam:   BP 118/76 (BP Location: Right Arm, Patient Position: Sitting, Cuff Size: Normal)   Pulse 77   Ht 5\' 5"  (1.651 m)   Wt 163 lb (73.9 kg)   LMP 02/04/2021   SpO2 98%   BMI 27.12 kg/m   Weight change: @WEIGHTCHANGE @ Height:   Height: 5\' 5"  (165.1 cm)  Ht Readings from Last 3 Encounters:  02/17/21 5\' 5"  (1.651 m)  02/17/20 5\' 6"  (1.676 m)  02/01/19 5' 5.75" (1.67 m)    General appearance: alert, cooperative and appears stated age Head: Normocephalic, without obvious abnormality, atraumatic Neck: no adenopathy, supple, symmetrical, trachea midline and thyroid normal to inspection and palpation Lungs: clear to auscultation bilaterally Cardiovascular: regular rate and rhythm Breasts: normal appearance, no masses or tenderness Abdomen: soft, non-tender; non distended,  no masses,  no organomegaly Extremities: extremities normal, atraumatic, no cyanosis or edema Skin: Skin color, texture, turgor normal. No rashes or lesions Lymph nodes: Cervical, supraclavicular, and axillary nodes normal. No abnormal inguinal nodes palpated Neurologic: Grossly normal   Pelvic: External genitalia:  no lesions              Urethra:  normal appearing urethra with no masses, tenderness or lesions              Bartholins  and Skenes: normal                 Vagina: normal appearing vagina with normal color and discharge, no lesions              Cervix: no lesions               Bimanual Exam:  Uterus:  normal size, contour, position, consistency, mobility, non-tender              Adnexa: no mass, fullness, tenderness               Rectovaginal: Confirms               Anus:  normal sphincter tone, no lesions  Caryn Bee chaperoned for the exam.  1. Well woman exam Discussed breast self exam Discussed calcium and vit D intake No pap this year  2. Encounter for surveillance of contraceptive pills Doing well - desogestrel-ethinyl estradiol (APRI)  0.15-30 MG-MCG tablet; Take 1 tablet by mouth daily.  Dispense: 84 tablet; Refill: 3  3. Vitamin D deficiency - VITAMIN D 25 Hydroxy (Vit-D Deficiency, Fractures)  4. Laboratory exam ordered as part of routine general medical examination - CBC - Comprehensive metabolic panel - Lipid panel

## 2021-02-17 ENCOUNTER — Encounter: Payer: Self-pay | Admitting: Obstetrics and Gynecology

## 2021-02-17 ENCOUNTER — Ambulatory Visit (INDEPENDENT_AMBULATORY_CARE_PROVIDER_SITE_OTHER): Payer: BC Managed Care – PPO | Admitting: Obstetrics and Gynecology

## 2021-02-17 ENCOUNTER — Other Ambulatory Visit: Payer: Self-pay

## 2021-02-17 VITALS — BP 118/76 | HR 77 | Ht 65.0 in | Wt 163.0 lb

## 2021-02-17 DIAGNOSIS — Z3041 Encounter for surveillance of contraceptive pills: Secondary | ICD-10-CM | POA: Diagnosis not present

## 2021-02-17 DIAGNOSIS — Z0000 Encounter for general adult medical examination without abnormal findings: Secondary | ICD-10-CM

## 2021-02-17 DIAGNOSIS — D2239 Melanocytic nevi of other parts of face: Secondary | ICD-10-CM | POA: Insufficient documentation

## 2021-02-17 DIAGNOSIS — E559 Vitamin D deficiency, unspecified: Secondary | ICD-10-CM

## 2021-02-17 DIAGNOSIS — L603 Nail dystrophy: Secondary | ICD-10-CM | POA: Insufficient documentation

## 2021-02-17 DIAGNOSIS — Z01419 Encounter for gynecological examination (general) (routine) without abnormal findings: Secondary | ICD-10-CM | POA: Diagnosis not present

## 2021-02-17 DIAGNOSIS — D485 Neoplasm of uncertain behavior of skin: Secondary | ICD-10-CM | POA: Insufficient documentation

## 2021-02-17 DIAGNOSIS — B079 Viral wart, unspecified: Secondary | ICD-10-CM | POA: Insufficient documentation

## 2021-02-17 MED ORDER — DESOGESTREL-ETHINYL ESTRADIOL 0.15-30 MG-MCG PO TABS
1.0000 | ORAL_TABLET | Freq: Every day | ORAL | 3 refills | Status: DC
Start: 2021-02-17 — End: 2022-01-26

## 2021-02-17 NOTE — Patient Instructions (Signed)

## 2021-02-18 LAB — COMPREHENSIVE METABOLIC PANEL
AG Ratio: 1.4 (calc) (ref 1.0–2.5)
ALT: 11 U/L (ref 6–29)
AST: 16 U/L (ref 10–30)
Albumin: 3.9 g/dL (ref 3.6–5.1)
Alkaline phosphatase (APISO): 112 U/L (ref 31–125)
BUN: 11 mg/dL (ref 7–25)
CO2: 21 mmol/L (ref 20–32)
Calcium: 9.4 mg/dL (ref 8.6–10.2)
Chloride: 108 mmol/L (ref 98–110)
Creat: 0.8 mg/dL (ref 0.50–1.10)
Globulin: 2.8 g/dL (calc) (ref 1.9–3.7)
Glucose, Bld: 105 mg/dL — ABNORMAL HIGH (ref 65–99)
Potassium: 4.3 mmol/L (ref 3.5–5.3)
Sodium: 139 mmol/L (ref 135–146)
Total Bilirubin: 0.3 mg/dL (ref 0.2–1.2)
Total Protein: 6.7 g/dL (ref 6.1–8.1)

## 2021-02-18 LAB — CBC
HCT: 43.5 % (ref 35.0–45.0)
Hemoglobin: 14.9 g/dL (ref 11.7–15.5)
MCH: 31.4 pg (ref 27.0–33.0)
MCHC: 34.3 g/dL (ref 32.0–36.0)
MCV: 91.6 fL (ref 80.0–100.0)
MPV: 9.5 fL (ref 7.5–12.5)
Platelets: 387 10*3/uL (ref 140–400)
RBC: 4.75 10*6/uL (ref 3.80–5.10)
RDW: 11.3 % (ref 11.0–15.0)
WBC: 9.1 10*3/uL (ref 3.8–10.8)

## 2021-02-18 LAB — LIPID PANEL
Cholesterol: 185 mg/dL (ref <200)
HDL: 52 mg/dL (ref >=50)
LDL Cholesterol (Calc): 101 mg/dL (calc) — ABNORMAL HIGH
Non-HDL Cholesterol (Calc): 133 mg/dL (calc) — ABNORMAL HIGH (ref <130)
Total CHOL/HDL Ratio: 3.6 (calc) (ref <5.0)
Triglycerides: 206 mg/dL — ABNORMAL HIGH (ref <150)

## 2021-02-18 LAB — VITAMIN D 25 HYDROXY (VIT D DEFICIENCY, FRACTURES): Vit D, 25-Hydroxy: 20 ng/mL — ABNORMAL LOW (ref 30–100)

## 2021-02-24 ENCOUNTER — Other Ambulatory Visit: Payer: Self-pay | Admitting: Obstetrics and Gynecology

## 2021-02-24 DIAGNOSIS — E559 Vitamin D deficiency, unspecified: Secondary | ICD-10-CM

## 2021-02-24 DIAGNOSIS — E7889 Other lipoprotein metabolism disorders: Secondary | ICD-10-CM

## 2022-01-26 ENCOUNTER — Other Ambulatory Visit: Payer: Self-pay | Admitting: Obstetrics and Gynecology

## 2022-01-26 DIAGNOSIS — Z3041 Encounter for surveillance of contraceptive pills: Secondary | ICD-10-CM

## 2022-01-26 NOTE — Telephone Encounter (Signed)
AEX 02/17/21. Scheduled AEX 02/21/22.

## 2022-02-07 NOTE — Progress Notes (Unsigned)
36 y.o. G0P0000 Single White or Caucasian Not Hispanic or Latino female here for annual exam.      No LMP recorded.          Sexually active: {yes no:314532}  The current method of family planning is {contraception:315051}.    Exercising: {yes no:314532}  {types:19826} Smoker:  {YES P5382123  Health Maintenance: Pap:   02-01-19 normal Neg HPV; 06-09-16 WNL, 06-09-15 ASCUS, negative hpv History of abnormal Pap:  yes, colposcopy in 2012, no cervical surgery. MMG:  n/a BMD:   n/a Colonoscopy: n/a TDaP:  2020 Gardasil: complete    reports that she has never smoked. She has never used smokeless tobacco. She reports that she does not drink alcohol and does not use drugs.  Past Medical History:  Diagnosis Date   Anxiety    Depression    OCD (obsessive compulsive disorder)    Vitamin D deficiency     Past Surgical History:  Procedure Laterality Date   COLPOSCOPY      Current Outpatient Medications  Medication Sig Dispense Refill   APRI 0.15-30 MG-MCG tablet TAKE 1 TABLET BY MOUTH EVERY DAY 84 tablet 0   FLUoxetine (PROZAC) 40 MG capsule TK 2 CS PO QD  1   No current facility-administered medications for this visit.    Family History  Problem Relation Age of Onset   Bone cancer Maternal Grandmother    Breast cancer Maternal Grandmother    Diabetes Maternal Grandfather    Mental illness Paternal Grandmother    Diabetes Paternal Grandfather    Heart disease Paternal Grandfather    Stroke Father 20       issues with balance, doing okay   Heart disease Father 1       MI s/p CABG    Review of Systems  Exam:   There were no vitals taken for this visit.  Weight change: '@WEIGHTCHANGE'$ @ Height:      Ht Readings from Last 3 Encounters:  02/17/21 '5\' 5"'$  (1.651 m)  02/17/20 '5\' 6"'$  (1.676 m)  02/01/19 5' 5.75" (1.67 m)    General appearance: alert, cooperative and appears stated age Head: Normocephalic, without obvious abnormality, atraumatic Neck: no adenopathy, supple,  symmetrical, trachea midline and thyroid {CHL AMB PHY EX THYROID NORM [phone]::"normal to inspection and palpation"} Lungs: clear to auscultation bilaterally Cardiovascular: regular rate and rhythm Breasts: {Exam; breast:13139::"normal appearance, no masses or tenderness"} Abdomen: soft, non-tender; non distended,  no masses,  no organomegaly Extremities: extremities normal, atraumatic, no cyanosis or edema Skin: Skin color, texture, turgor normal. No rashes or lesions Lymph nodes: Cervical, supraclavicular, and axillary nodes normal. No abnormal inguinal nodes palpated Neurologic: Grossly normal   Pelvic: External genitalia:  no lesions              Urethra:  normal appearing urethra with no masses, tenderness or lesions              Bartholins and Skenes: normal                 Vagina: normal appearing vagina with normal color and discharge, no lesions              Cervix: {CHL AMB PHY EX CERVIX NORM DEFAULT:[phone]::"no lesions"}               Bimanual Exam:  Uterus:  {CHL AMB PHY EX UTERUS NORM [phone]::"normal size, contour, position, consistency, mobility, non-tender"}              Adnexa: {  CHL AMB PHY EX ADNEXA NO MASS [phone]::"no mass, fullness, tenderness"}               Rectovaginal: Confirms               Anus:  normal sphincter tone, no lesions  *** chaperoned for the exam.  A:  Well Woman with normal exam  P:

## 2022-02-21 ENCOUNTER — Encounter: Payer: Self-pay | Admitting: Obstetrics and Gynecology

## 2022-02-21 ENCOUNTER — Ambulatory Visit (INDEPENDENT_AMBULATORY_CARE_PROVIDER_SITE_OTHER): Payer: BC Managed Care – PPO | Admitting: Obstetrics and Gynecology

## 2022-02-21 VITALS — BP 124/82 | HR 82 | Resp 18 | Ht 65.35 in | Wt 168.4 lb

## 2022-02-21 DIAGNOSIS — Z3041 Encounter for surveillance of contraceptive pills: Secondary | ICD-10-CM

## 2022-02-21 DIAGNOSIS — E559 Vitamin D deficiency, unspecified: Secondary | ICD-10-CM

## 2022-02-21 DIAGNOSIS — E7889 Other lipoprotein metabolism disorders: Secondary | ICD-10-CM

## 2022-02-21 DIAGNOSIS — Z01419 Encounter for gynecological examination (general) (routine) without abnormal findings: Secondary | ICD-10-CM | POA: Diagnosis not present

## 2022-02-21 MED ORDER — DESOGESTREL-ETHINYL ESTRADIOL 0.15-30 MG-MCG PO TABS: 1.0000 | ORAL_TABLET | Freq: Every day | ORAL | 3 refills | Status: DC

## 2022-02-22 LAB — LIPID PANEL
Cholesterol: 217 mg/dL — ABNORMAL HIGH (ref <200)
HDL: 53 mg/dL (ref >=50)
LDL Cholesterol (Calc): 124 mg/dL (calc) — ABNORMAL HIGH
Non-HDL Cholesterol (Calc): 164 mg/dL (calc) — ABNORMAL HIGH (ref <130)
Total CHOL/HDL Ratio: 4.1 (calc) (ref <5.0)
Triglycerides: 262 mg/dL — ABNORMAL HIGH (ref <150)

## 2022-02-22 LAB — VITAMIN D 25 HYDROXY (VIT D DEFICIENCY, FRACTURES): Vit D, 25-Hydroxy: 33 ng/mL (ref 30–100)

## 2023-02-23 ENCOUNTER — Ambulatory Visit: Payer: BC Managed Care – PPO | Admitting: Obstetrics and Gynecology

## 2023-03-10 DIAGNOSIS — Z3041 Encounter for surveillance of contraceptive pills: Secondary | ICD-10-CM

## 2023-03-10 MED ORDER — DESOGESTREL-ETHINYL ESTRADIOL 0.15-30 MG-MCG PO TABS: 1.0000 | ORAL_TABLET | Freq: Every day | ORAL | 0 refills | Status: DC

## 2023-03-10 NOTE — Telephone Encounter (Signed)
Last AEX 02/21/2022--currently scheduled for 04/04/2023.  Will send msg to appt desk to ammend appt and let me know once done prior to sending rx refill.

## 2023-03-10 NOTE — Telephone Encounter (Signed)
Appt date/time remained the same. Provider changed from JJ to [facility].

## 2023-04-04 ENCOUNTER — Ambulatory Visit: Payer: BC Managed Care – PPO | Admitting: Radiology

## 2023-04-04 ENCOUNTER — Encounter: Payer: Self-pay | Admitting: Radiology

## 2023-04-04 VITALS — BP 116/80 | Ht 65.5 in | Wt 169.0 lb

## 2023-04-04 DIAGNOSIS — Z01419 Encounter for gynecological examination (general) (routine) without abnormal findings: Secondary | ICD-10-CM | POA: Diagnosis not present

## 2023-04-04 DIAGNOSIS — N920 Excessive and frequent menstruation with regular cycle: Secondary | ICD-10-CM | POA: Diagnosis not present

## 2023-04-04 DIAGNOSIS — Z113 Encounter for screening for infections with a predominantly sexual mode of transmission: Secondary | ICD-10-CM

## 2023-04-04 MED ORDER — JUNEL FE 24 1-20 MG-MCG(24) PO TABS: 1.0000 | ORAL_TABLET | Freq: Every day | ORAL | 4 refills | Status: DC

## 2023-04-04 NOTE — Progress Notes (Signed)
   Kristin Whitehead 06-25-1986 [id number]   History:  37 y.o. G0 presents for annual exam. C/o headaches before menses no vision changes. Periods have become heavier since having COVID 6 months ago. Elects for STI screening today.  Gynecologic History Patient's last menstrual period was 03/25/2023 (exact date). Period Cycle (Days): 28 Period Duration (Days): 5 Period Pattern: Regular Menstrual Flow: Light, Moderate, Heavy Menstrual Control: Maxi pad, Thin pad Dysmenorrhea: (!) Moderate Dysmenorrhea Symptoms: Cramping Contraception/Family planning: OCP (estrogen/progesterone) Sexually active: yes Last Pap: 2020. Results were: normal   Obstetric History OB History  Gravida Para Term Preterm AB Living  0 0 0 0 0 0  SAB IAB Ectopic Multiple Live Births  0 0 0 0 0     The following portions of the patient's history were reviewed and updated as appropriate: allergies, current medications, past family history, past medical history, past social history, past surgical history, and problem list.  Review of Systems Pertinent items noted in HPI and remainder of comprehensive ROS otherwise negative.   Past medical history, past surgical history, family history and social history were all reviewed and documented in the EPIC chart.   Exam:  Vitals:   04/04/23 1312  BP: 116/80  Weight: 169 lb (76.7 kg)  Height: 5' 5.5" (1.664 m)   Body mass index is 27.7 kg/m.  General appearance:  Normal Thyroid:  Symmetrical, normal in size, without palpable masses or nodularity. Respiratory  Auscultation:  Clear without wheezing or rhonchi Cardiovascular  Auscultation:  Regular rate, without rubs, murmurs or gallops  Edema/varicosities:  Not grossly evident Abdominal  Soft,nontender, without masses, guarding or rebound.  Liver/spleen:  No organomegaly noted  Hernia:  None appreciated  Skin  Inspection:  Grossly normal Breasts: Examined lying and sitting.   Right: Without masses,  retractions, nipple discharge or axillary adenopathy.   Left: Without masses, retractions, nipple discharge or axillary adenopathy. Genitourinary   Inguinal/mons:  Normal without inguinal adenopathy  External genitalia:  Normal appearing vulva with no masses, tenderness, or lesions  BUS/Urethra/Skene's glands:  Normal without masses or exudate  Vagina:  Normal appearing with normal color and discharge, no lesions  Cervix:  Normal appearing without discharge or lesions  Uterus:  Normal in size, shape and contour.  Mobile, nontender  Adnexa/parametria:     Rt: Normal in size, without masses or tenderness.   Lt: Normal in size, without masses or tenderness.  Anus and perineum: Normal   Raynelle Fanning, CMA present for exam  Assessment/Plan:   1. Well woman exam with routine gynecological exam Pap 2025  2. Screening for STDs (sexually transmitted diseases)  - SURESWAB CT/NG/T. vaginalis  3. Menorrhagia with regular cycle Will change pill to help with menorrhagia and premenstrual  headaches - Norethindrone Acetate-Ethinyl Estrad-FE (JUNEL FE 24) 1-20 MG-MCG(24) tablet; Take 1 tablet by mouth daily.  Dispense: 84 tablet; Refill: 4    Discussed SBE, pap and STI screening as directed/appropriate. Recommend of exercise weekly, including weight bearing exercise. Encouraged the use of seatbelts and sunscreen. Return in 1 year for annual or as needed.   Arlie Solomons B WHNP-BC 1:39 PM 04/04/2023

## 2023-04-06 DIAGNOSIS — F411 Generalized anxiety disorder: Secondary | ICD-10-CM | POA: Diagnosis not present

## 2023-05-16 DIAGNOSIS — K219 Gastro-esophageal reflux disease without esophagitis: Secondary | ICD-10-CM | POA: Diagnosis not present

## 2023-05-16 DIAGNOSIS — E663 Overweight: Secondary | ICD-10-CM | POA: Diagnosis not present

## 2023-05-16 DIAGNOSIS — R194 Change in bowel habit: Secondary | ICD-10-CM | POA: Diagnosis not present

## 2023-05-16 DIAGNOSIS — Z6827 Body mass index (BMI) 27.0-27.9, adult: Secondary | ICD-10-CM | POA: Diagnosis not present

## 2023-05-25 DIAGNOSIS — F900 Attention-deficit hyperactivity disorder, predominantly inattentive type: Secondary | ICD-10-CM | POA: Diagnosis not present

## 2023-05-25 DIAGNOSIS — F605 Obsessive-compulsive personality disorder: Secondary | ICD-10-CM | POA: Diagnosis not present

## 2023-05-25 DIAGNOSIS — F331 Major depressive disorder, recurrent, moderate: Secondary | ICD-10-CM | POA: Diagnosis not present

## 2023-05-25 DIAGNOSIS — F411 Generalized anxiety disorder: Secondary | ICD-10-CM | POA: Diagnosis not present

## 2023-05-31 DIAGNOSIS — F331 Major depressive disorder, recurrent, moderate: Secondary | ICD-10-CM | POA: Diagnosis not present

## 2023-06-08 DIAGNOSIS — F331 Major depressive disorder, recurrent, moderate: Secondary | ICD-10-CM | POA: Diagnosis not present

## 2023-06-14 ENCOUNTER — Other Ambulatory Visit: Payer: Self-pay | Admitting: Radiology

## 2023-06-14 DIAGNOSIS — Z01419 Encounter for gynecological examination (general) (routine) without abnormal findings: Secondary | ICD-10-CM

## 2023-06-14 NOTE — Telephone Encounter (Signed)
Med refill request: Apri #84 Patient was changed to Junel FE 24 04/04/23 Last AEX: 04/04/23 Next AEX:  none scheduled Last MMG (if hormonal med) n/a Refill denied.  Pharmacy notified therapy changed.  Sent to provider for review.

## 2023-06-15 DIAGNOSIS — F331 Major depressive disorder, recurrent, moderate: Secondary | ICD-10-CM | POA: Diagnosis not present

## 2023-06-22 DIAGNOSIS — F331 Major depressive disorder, recurrent, moderate: Secondary | ICD-10-CM | POA: Diagnosis not present

## 2023-06-28 DIAGNOSIS — F331 Major depressive disorder, recurrent, moderate: Secondary | ICD-10-CM | POA: Diagnosis not present

## 2023-06-30 DIAGNOSIS — R194 Change in bowel habit: Secondary | ICD-10-CM | POA: Diagnosis not present

## 2023-07-05 DIAGNOSIS — F331 Major depressive disorder, recurrent, moderate: Secondary | ICD-10-CM | POA: Diagnosis not present

## 2023-07-10 DIAGNOSIS — K222 Esophageal obstruction: Secondary | ICD-10-CM | POA: Diagnosis not present

## 2023-07-10 DIAGNOSIS — K3189 Other diseases of stomach and duodenum: Secondary | ICD-10-CM | POA: Diagnosis not present

## 2023-07-10 DIAGNOSIS — K219 Gastro-esophageal reflux disease without esophagitis: Secondary | ICD-10-CM | POA: Diagnosis not present

## 2023-07-10 DIAGNOSIS — K449 Diaphragmatic hernia without obstruction or gangrene: Secondary | ICD-10-CM | POA: Diagnosis not present

## 2023-07-10 DIAGNOSIS — K2210 Ulcer of esophagus without bleeding: Secondary | ICD-10-CM | POA: Diagnosis not present

## 2023-07-20 DIAGNOSIS — F900 Attention-deficit hyperactivity disorder, predominantly inattentive type: Secondary | ICD-10-CM | POA: Diagnosis not present

## 2023-07-20 DIAGNOSIS — F331 Major depressive disorder, recurrent, moderate: Secondary | ICD-10-CM | POA: Diagnosis not present

## 2023-07-20 DIAGNOSIS — F411 Generalized anxiety disorder: Secondary | ICD-10-CM | POA: Diagnosis not present

## 2023-07-20 DIAGNOSIS — F605 Obsessive-compulsive personality disorder: Secondary | ICD-10-CM | POA: Diagnosis not present

## 2023-08-01 DIAGNOSIS — Z6826 Body mass index (BMI) 26.0-26.9, adult: Secondary | ICD-10-CM | POA: Diagnosis not present

## 2023-08-01 DIAGNOSIS — K219 Gastro-esophageal reflux disease without esophagitis: Secondary | ICD-10-CM | POA: Diagnosis not present

## 2023-08-01 DIAGNOSIS — R194 Change in bowel habit: Secondary | ICD-10-CM | POA: Diagnosis not present

## 2023-08-01 DIAGNOSIS — E663 Overweight: Secondary | ICD-10-CM | POA: Diagnosis not present

## 2023-08-09 DIAGNOSIS — F331 Major depressive disorder, recurrent, moderate: Secondary | ICD-10-CM | POA: Diagnosis not present

## 2023-08-15 DIAGNOSIS — F331 Major depressive disorder, recurrent, moderate: Secondary | ICD-10-CM | POA: Diagnosis not present

## 2023-09-01 DIAGNOSIS — F331 Major depressive disorder, recurrent, moderate: Secondary | ICD-10-CM | POA: Diagnosis not present

## 2023-09-21 DIAGNOSIS — F605 Obsessive-compulsive personality disorder: Secondary | ICD-10-CM | POA: Diagnosis not present

## 2023-09-21 DIAGNOSIS — F331 Major depressive disorder, recurrent, moderate: Secondary | ICD-10-CM | POA: Diagnosis not present

## 2023-09-21 DIAGNOSIS — F900 Attention-deficit hyperactivity disorder, predominantly inattentive type: Secondary | ICD-10-CM | POA: Diagnosis not present

## 2023-09-21 DIAGNOSIS — F411 Generalized anxiety disorder: Secondary | ICD-10-CM | POA: Diagnosis not present

## 2023-10-04 DIAGNOSIS — F331 Major depressive disorder, recurrent, moderate: Secondary | ICD-10-CM | POA: Diagnosis not present

## 2023-10-16 DIAGNOSIS — Z6827 Body mass index (BMI) 27.0-27.9, adult: Secondary | ICD-10-CM | POA: Diagnosis not present

## 2023-10-16 DIAGNOSIS — K581 Irritable bowel syndrome with constipation: Secondary | ICD-10-CM | POA: Diagnosis not present

## 2023-10-16 DIAGNOSIS — E663 Overweight: Secondary | ICD-10-CM | POA: Diagnosis not present

## 2023-10-16 DIAGNOSIS — K219 Gastro-esophageal reflux disease without esophagitis: Secondary | ICD-10-CM | POA: Diagnosis not present

## 2023-10-18 DIAGNOSIS — F331 Major depressive disorder, recurrent, moderate: Secondary | ICD-10-CM | POA: Diagnosis not present

## 2023-11-01 DIAGNOSIS — F331 Major depressive disorder, recurrent, moderate: Secondary | ICD-10-CM | POA: Diagnosis not present

## 2023-11-07 NOTE — Telephone Encounter (Signed)
 Jami -AEX 04/04/23, do you recommend OV to discuss?

## 2023-11-10 ENCOUNTER — Other Ambulatory Visit: Payer: Self-pay | Admitting: Radiology

## 2023-11-10 DIAGNOSIS — Z3041 Encounter for surveillance of contraceptive pills: Secondary | ICD-10-CM

## 2023-11-10 MED ORDER — NEXTSTELLIS 3-14.2 MG PO TABS: 1.0000 | ORAL_TABLET | Freq: Every day | ORAL | 1 refills | Status: DC

## 2023-11-13 NOTE — Telephone Encounter (Signed)
 Message received:   Interface, [facility] Out  [facility] Rx Refill An error occurred while processing the e-prescribing message.  The message was not sent electronically to the requested pharmacy. Contact the pharmacy about the new prescription.  Code: 28 - Receiver System Error Mailboxed message not delivered in a timely manner.   Jami -please confirm proceeding with Rx [facility] and I will contact pharmacy.

## 2023-11-14 NOTE — Telephone Encounter (Signed)
 Yes, please contact pharmacy. If we continue to have issues with [facility] can send to [facility].

## 2023-11-15 MED ORDER — NEXTSTELLIS 3-14.2 MG PO TABS: 1.0000 | ORAL_TABLET | Freq: Every day | ORAL | 1 refills | Status: DC

## 2023-11-15 NOTE — Telephone Encounter (Signed)
 Call placed to [facility], spoke with Amy. Was [facility] not available through [facility]. Rx discontinued.   Call placed to patient, advised as seen above. New Rx sent to [facility], contact information provided to patient. Patient aware to contact office if she needs any additional assistance.   Routing to provider for final review. Patient is agreeable to disposition. Will close encounter.

## 2023-11-29 DIAGNOSIS — F605 Obsessive-compulsive personality disorder: Secondary | ICD-10-CM | POA: Diagnosis not present

## 2023-11-29 DIAGNOSIS — F900 Attention-deficit hyperactivity disorder, predominantly inattentive type: Secondary | ICD-10-CM | POA: Diagnosis not present

## 2023-11-29 DIAGNOSIS — F411 Generalized anxiety disorder: Secondary | ICD-10-CM | POA: Diagnosis not present

## 2023-11-29 DIAGNOSIS — F331 Major depressive disorder, recurrent, moderate: Secondary | ICD-10-CM | POA: Diagnosis not present

## 2023-12-11 DIAGNOSIS — L7211 Pilar cyst: Secondary | ICD-10-CM | POA: Diagnosis not present

## 2023-12-11 DIAGNOSIS — B079 Viral wart, unspecified: Secondary | ICD-10-CM | POA: Diagnosis not present

## 2024-01-15 DIAGNOSIS — B079 Viral wart, unspecified: Secondary | ICD-10-CM | POA: Diagnosis not present

## 2024-01-15 DIAGNOSIS — L7211 Pilar cyst: Secondary | ICD-10-CM | POA: Diagnosis not present

## 2024-02-05 ENCOUNTER — Other Ambulatory Visit: Payer: Self-pay | Admitting: Radiology

## 2024-02-05 DIAGNOSIS — N920 Excessive and frequent menstruation with regular cycle: Secondary | ICD-10-CM

## 2024-02-06 NOTE — Telephone Encounter (Signed)
 Med refill request: [facility]   Last AEX: 04/04/23 Next AEX: 04/04/24 Last MMG (if hormonal med) n/a Refill authorized: [facility] .  RX was sent 11/15/23 for #84 with 1 refill.  Patient should have enough medication until AEX.  Sent to provider for review and approval/denial.

## 2024-02-13 DIAGNOSIS — K219 Gastro-esophageal reflux disease without esophagitis: Secondary | ICD-10-CM | POA: Diagnosis not present

## 2024-02-13 DIAGNOSIS — R110 Nausea: Secondary | ICD-10-CM | POA: Diagnosis not present

## 2024-02-13 DIAGNOSIS — Z6827 Body mass index (BMI) 27.0-27.9, adult: Secondary | ICD-10-CM | POA: Diagnosis not present

## 2024-02-13 DIAGNOSIS — E663 Overweight: Secondary | ICD-10-CM | POA: Diagnosis not present

## 2024-02-13 DIAGNOSIS — K581 Irritable bowel syndrome with constipation: Secondary | ICD-10-CM | POA: Diagnosis not present

## 2024-02-26 DIAGNOSIS — B079 Viral wart, unspecified: Secondary | ICD-10-CM | POA: Diagnosis not present

## 2024-02-26 DIAGNOSIS — L7211 Pilar cyst: Secondary | ICD-10-CM | POA: Diagnosis not present

## 2024-02-28 DIAGNOSIS — F411 Generalized anxiety disorder: Secondary | ICD-10-CM | POA: Diagnosis not present

## 2024-02-28 DIAGNOSIS — F605 Obsessive-compulsive personality disorder: Secondary | ICD-10-CM | POA: Diagnosis not present

## 2024-02-28 DIAGNOSIS — F900 Attention-deficit hyperactivity disorder, predominantly inattentive type: Secondary | ICD-10-CM | POA: Diagnosis not present

## 2024-02-28 DIAGNOSIS — F331 Major depressive disorder, recurrent, moderate: Secondary | ICD-10-CM | POA: Diagnosis not present

## 2024-04-04 ENCOUNTER — Encounter: Payer: Self-pay | Admitting: Radiology

## 2024-04-04 ENCOUNTER — Other Ambulatory Visit (HOSPITAL_COMMUNITY)
Admission: RE | Admit: 2024-04-04 | Discharge: 2024-04-04 | Disposition: A | Discharge status: Home / Self Care | Source: Ambulatory Visit | Attending: Radiology | Admitting: Radiology

## 2024-04-04 ENCOUNTER — Ambulatory Visit: Admitting: Radiology

## 2024-04-04 VITALS — BP 116/78 | HR 80 | Ht 66.5 in | Wt 172.0 lb

## 2024-04-04 DIAGNOSIS — Z1331 Encounter for screening for depression: Secondary | ICD-10-CM

## 2024-04-04 DIAGNOSIS — Z113 Encounter for screening for infections with a predominantly sexual mode of transmission: Secondary | ICD-10-CM | POA: Diagnosis not present

## 2024-04-04 DIAGNOSIS — Z01419 Encounter for gynecological examination (general) (routine) without abnormal findings: Secondary | ICD-10-CM | POA: Diagnosis not present

## 2024-04-04 DIAGNOSIS — Z1321 Encounter for screening for nutritional disorder: Secondary | ICD-10-CM | POA: Diagnosis not present

## 2024-04-04 DIAGNOSIS — Z3041 Encounter for surveillance of contraceptive pills: Secondary | ICD-10-CM

## 2024-04-04 DIAGNOSIS — F331 Major depressive disorder, recurrent, moderate: Secondary | ICD-10-CM | POA: Diagnosis not present

## 2024-04-04 DIAGNOSIS — Z13228 Encounter for screening for other metabolic disorders: Secondary | ICD-10-CM | POA: Diagnosis not present

## 2024-04-04 DIAGNOSIS — K581 Irritable bowel syndrome with constipation: Secondary | ICD-10-CM | POA: Diagnosis not present

## 2024-04-04 DIAGNOSIS — Z13220 Encounter for screening for lipoid disorders: Secondary | ICD-10-CM | POA: Diagnosis not present

## 2024-04-04 DIAGNOSIS — Z1329 Encounter for screening for other suspected endocrine disorder: Secondary | ICD-10-CM | POA: Diagnosis not present

## 2024-04-04 MED ORDER — NORGESTIMATE-ETH ESTRADIOL 0.18/0.215/0.25 MG-25 MCG PO TABS: 1.0000 | ORAL_TABLET | Freq: Every day | ORAL | 4 refills | Status: AC

## 2024-04-04 NOTE — Patient Instructions (Signed)

## 2024-04-04 NOTE — Progress Notes (Signed)
 Kristin Whitehead 1985/09/01 [id number]   History:  38 y.o. G0 presents for annual exam.Complains of amenorrhea with COC's (3 periods in the last year, brown, light, short) with headaches and cramps.  Gynecologic History Patient's last menstrual period was 03/18/2024 (exact date). Period Cycle (Days):  (3 periods x's 1 year) Period Pattern: (!) Irregular Menstrual Flow: Light Menstrual Control: Thin pad, Maxi pad Dysmenorrhea: (!) Mild Dysmenorrhea Symptoms: Cramping, Headache Contraception/Family planning: OCP (estrogen/progesterone) Sexually active: yes Last Pap: 2020. Results were: normal   Obstetric History OB History  Gravida Para Term Preterm AB Living  0 0 0 0 0 0  SAB IAB Ectopic Multiple Live Births  0 0 0 0 0       04/04/2024    2:25 PM  Depression screen PHQ 2/9  Decreased Interest 1  Down, Depressed, Hopeless 1  PHQ - 2 Score 2  Altered sleeping 1  Tired, decreased energy 1  Change in appetite 0  Feeling bad or failure about yourself  0  Trouble concentrating 1  Moving slowly or fidgety/restless 0  Suicidal thoughts 0  PHQ-9 Score 5  Difficult doing work/chores Somewhat difficult     The following portions of the patient's history were reviewed and updated as appropriate: allergies, current medications, past family history, past medical history, past social history, past surgical history, and problem list.  Review of Systems  All other systems reviewed and are negative.   Past medical history, past surgical history, family history and social history were all reviewed and documented in the EPIC chart.  Exam:  Vitals:   04/04/24 1423  BP: 116/78  Pulse: 80  SpO2: 99%  Weight: 172 lb (78 kg)  Height: 5' 6.5 (1.689 m)   Body mass index is 27.35 kg/m.  Physical Exam Vitals and nursing note reviewed. Exam conducted with a chaperone present.  Constitutional:      Appearance: Normal appearance. She is normal weight.  HENT:     Head: Normocephalic  and atraumatic.  Neck:     Thyroid: No thyroid mass, thyromegaly or thyroid tenderness.  Cardiovascular:     Rate and Rhythm: Regular rhythm.     Heart sounds: Normal heart sounds.  Pulmonary:     Effort: Pulmonary effort is normal.     Breath sounds: Normal breath sounds.  Chest:  Breasts:    Breasts are symmetrical.     Right: Normal. No inverted nipple, mass, nipple discharge, skin change or tenderness.     Left: Normal. No inverted nipple, mass, nipple discharge, skin change or tenderness.  Abdominal:     General: Abdomen is flat. Bowel sounds are normal.     Palpations: Abdomen is soft.  Genitourinary:    General: Normal vulva.     Vagina: Normal. No vaginal discharge, bleeding or lesions.     Cervix: Normal. No discharge or lesion.     Uterus: Normal. Not enlarged and not tender.      Adnexa: Right adnexa normal and left adnexa normal.       Right: No mass, tenderness or fullness.         Left: No mass, tenderness or fullness.    Lymphadenopathy:     Upper Body:     Right upper body: No axillary adenopathy.     Left upper body: No axillary adenopathy.  Skin:    General: Skin is warm and dry.  Neurological:     Mental Status: She is alert and oriented to person, place, and time.  Psychiatric:        Mood and Affect: Mood normal.        Thought Content: Thought content normal.        Judgment: Judgment normal.      Darice Hoit, CMA present for exam  Assessment/Plan:   1. Well woman exam with routine gynecological exam (Primary) - Cytology - PAP( [facility])  2. Screening for STDs (sexually transmitted diseases) - Cytology - PAP( [place])  3. Oral contraceptive pill surveillance Will change OCP again to help with symptoms - Norgestimate -Eth Estradiol  (TRI-LO-SPRINTEC) 0.18/0.215/0.25 MG-25 MCG TABS; Take 1 tablet by mouth daily.  Dispense: 84 tablet; Refill: 4  4. Depression screen     Return in about 1 year (around 04/04/2025) for Annual.  Arena Lindahl,  Jontae Sonier B WHNP-BC 2:50 PM 04/04/2024

## 2024-04-05 ENCOUNTER — Ambulatory Visit: Payer: BC Managed Care – PPO | Admitting: Radiology

## 2024-04-10 ENCOUNTER — Ambulatory Visit: Payer: Self-pay | Admitting: Radiology

## 2024-04-10 DIAGNOSIS — B3731 Acute candidiasis of vulva and vagina: Secondary | ICD-10-CM

## 2024-04-10 LAB — CYTOLOGY - PAP
Adequacy: ABSENT
Chlamydia: NEGATIVE
Comment: NEGATIVE
Comment: NEGATIVE
Comment: NORMAL
Diagnosis: NEGATIVE
High risk HPV: NEGATIVE
Neisseria Gonorrhea: NEGATIVE

## 2024-04-10 MED ORDER — FLUCONAZOLE 150 MG PO TABS: 150.0000 mg | ORAL_TABLET | ORAL | 0 refills | Status: AC

## 2024-05-15 ENCOUNTER — Other Ambulatory Visit: Payer: Self-pay | Admitting: Radiology

## 2024-05-15 NOTE — Telephone Encounter (Signed)
 Medication refill request: [email or web address]   Last AEX:  04/04/24 Next AEX: 04/08/25 Last MMG (if hormonal medication request): n/a Refill authorized: patient no longer taking medication

## 2024-05-19 DIAGNOSIS — Z20822 Contact with and (suspected) exposure to covid-19: Secondary | ICD-10-CM | POA: Diagnosis not present

## 2024-05-19 DIAGNOSIS — R059 Cough, unspecified: Secondary | ICD-10-CM | POA: Diagnosis not present

## 2024-05-19 DIAGNOSIS — J029 Acute pharyngitis, unspecified: Secondary | ICD-10-CM | POA: Diagnosis not present

## 2024-05-29 DIAGNOSIS — F331 Major depressive disorder, recurrent, moderate: Secondary | ICD-10-CM | POA: Diagnosis not present

## 2024-05-29 DIAGNOSIS — F900 Attention-deficit hyperactivity disorder, predominantly inattentive type: Secondary | ICD-10-CM | POA: Diagnosis not present

## 2024-05-29 DIAGNOSIS — F411 Generalized anxiety disorder: Secondary | ICD-10-CM | POA: Diagnosis not present

## 2024-05-29 DIAGNOSIS — F605 Obsessive-compulsive personality disorder: Secondary | ICD-10-CM | POA: Diagnosis not present

## 2024-06-17 DIAGNOSIS — K581 Irritable bowel syndrome with constipation: Secondary | ICD-10-CM | POA: Diagnosis not present

## 2024-06-17 DIAGNOSIS — Z83719 Family history of colon polyps, unspecified: Secondary | ICD-10-CM | POA: Diagnosis not present

## 2024-06-17 DIAGNOSIS — K219 Gastro-esophageal reflux disease without esophagitis: Secondary | ICD-10-CM | POA: Diagnosis not present

## 2024-06-17 DIAGNOSIS — Z6828 Body mass index (BMI) 28.0-28.9, adult: Secondary | ICD-10-CM | POA: Diagnosis not present

## 2024-06-17 DIAGNOSIS — E663 Overweight: Secondary | ICD-10-CM | POA: Diagnosis not present

## 2024-06-30 DIAGNOSIS — J029 Acute pharyngitis, unspecified: Secondary | ICD-10-CM | POA: Diagnosis not present

## 2024-06-30 DIAGNOSIS — Z20822 Contact with and (suspected) exposure to covid-19: Secondary | ICD-10-CM | POA: Diagnosis not present

## 2024-06-30 DIAGNOSIS — R059 Cough, unspecified: Secondary | ICD-10-CM | POA: Diagnosis not present

## 2024-07-17 DIAGNOSIS — R051 Acute cough: Secondary | ICD-10-CM | POA: Diagnosis not present

## 2024-07-17 DIAGNOSIS — H6122 Impacted cerumen, left ear: Secondary | ICD-10-CM | POA: Diagnosis not present

## 2024-07-17 DIAGNOSIS — J209 Acute bronchitis, unspecified: Secondary | ICD-10-CM | POA: Diagnosis not present

## 2024-07-17 DIAGNOSIS — J189 Pneumonia, unspecified organism: Secondary | ICD-10-CM | POA: Diagnosis not present

## 2024-08-05 DIAGNOSIS — J4530 Mild persistent asthma, uncomplicated: Secondary | ICD-10-CM | POA: Diagnosis not present

## 2024-08-05 DIAGNOSIS — Z23 Encounter for immunization: Secondary | ICD-10-CM | POA: Diagnosis not present

## 2024-08-10 DIAGNOSIS — F411 Generalized anxiety disorder: Secondary | ICD-10-CM | POA: Diagnosis not present

## 2024-08-17 DIAGNOSIS — F411 Generalized anxiety disorder: Secondary | ICD-10-CM | POA: Diagnosis not present

## 2025-04-08 ENCOUNTER — Ambulatory Visit: Admitting: Radiology
